# Patient Record
Sex: Female | Born: 1990 | Race: Black or African American | Hispanic: No | Marital: Single | Smoking: Never smoker
Health system: Southern US, Community
[De-identification: ages and names within clinical notes are randomized; demographics above are authoritative.]

## PROBLEM LIST (undated history)

## (undated) DIAGNOSIS — J40 Bronchitis, not specified as acute or chronic: Secondary | ICD-10-CM

## (undated) DIAGNOSIS — Z789 Other specified health status: Secondary | ICD-10-CM

## (undated) HISTORY — PX: ADENOIDECTOMY: SUR15

---
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
## 2011-02-05 ENCOUNTER — Emergency Department (HOSPITAL_COMMUNITY)
Admission: EM | Admit: 2011-02-05 | Discharge: 2011-02-05 | Discharge status: Against Medical Advice | Payer: Self-pay | Attending: Emergency Medicine | Admitting: Emergency Medicine

## 2011-02-05 DIAGNOSIS — R109 Unspecified abdominal pain: Secondary | ICD-10-CM | POA: Insufficient documentation

## 2011-02-19 ENCOUNTER — Emergency Department (HOSPITAL_COMMUNITY)
Admission: EM | Admit: 2011-02-19 | Discharge: 2011-02-19 | Disposition: A | Discharge status: Home / Self Care | Payer: Self-pay | Attending: Emergency Medicine | Admitting: Emergency Medicine

## 2011-02-19 ENCOUNTER — Emergency Department (HOSPITAL_COMMUNITY): Payer: Self-pay

## 2011-02-19 DIAGNOSIS — IMO0002 Reserved for concepts with insufficient information to code with codable children: Secondary | ICD-10-CM | POA: Insufficient documentation

## 2011-02-19 DIAGNOSIS — W1809XA Striking against other object with subsequent fall, initial encounter: Secondary | ICD-10-CM | POA: Insufficient documentation

## 2011-02-19 DIAGNOSIS — S8000XA Contusion of unspecified knee, initial encounter: Secondary | ICD-10-CM | POA: Insufficient documentation

## 2011-02-19 DIAGNOSIS — S8990XA Unspecified injury of unspecified lower leg, initial encounter: Secondary | ICD-10-CM | POA: Insufficient documentation

## 2011-02-19 DIAGNOSIS — M25569 Pain in unspecified knee: Secondary | ICD-10-CM | POA: Insufficient documentation

## 2011-02-23 ENCOUNTER — Emergency Department (HOSPITAL_COMMUNITY)
Admission: EM | Admit: 2011-02-23 | Discharge: 2011-02-23 | Disposition: A | Discharge status: Home / Self Care | Payer: Self-pay | Attending: Emergency Medicine | Admitting: Emergency Medicine

## 2011-02-23 DIAGNOSIS — X58XXXA Exposure to other specified factors, initial encounter: Secondary | ICD-10-CM | POA: Insufficient documentation

## 2011-02-23 DIAGNOSIS — M25569 Pain in unspecified knee: Secondary | ICD-10-CM | POA: Insufficient documentation

## 2011-02-23 DIAGNOSIS — Y929 Unspecified place or not applicable: Secondary | ICD-10-CM | POA: Insufficient documentation

## 2011-02-23 DIAGNOSIS — R509 Fever, unspecified: Secondary | ICD-10-CM | POA: Insufficient documentation

## 2011-02-23 DIAGNOSIS — T1490XA Injury, unspecified, initial encounter: Secondary | ICD-10-CM | POA: Insufficient documentation

## 2011-02-23 DIAGNOSIS — R209 Unspecified disturbances of skin sensation: Secondary | ICD-10-CM | POA: Insufficient documentation

## 2011-02-23 DIAGNOSIS — R609 Edema, unspecified: Secondary | ICD-10-CM | POA: Insufficient documentation

## 2011-03-07 LAB — CBC
HCT: 40.6 % (ref 36.0–46.0)
Hemoglobin: 13.8 g/dL (ref 12.0–15.0)
MCH: 30.1 pg (ref 26.0–34.0)
MCV: 88.5 fL (ref 78.0–100.0)
Platelets: 305 10*3/uL (ref 150–400)
RBC: 4.59 MIL/uL (ref 3.87–5.11)
WBC: 11.8 10*3/uL — ABNORMAL HIGH (ref 4.0–10.5)

## 2011-03-07 LAB — RAPID URINE DRUG SCREEN, HOSP PERFORMED
Amphetamines: NOT DETECTED
Benzodiazepines: NOT DETECTED
Cocaine: NOT DETECTED
Tetrahydrocannabinol: NOT DETECTED

## 2011-03-07 LAB — URINALYSIS, ROUTINE W REFLEX MICROSCOPIC
Bilirubin Urine: NEGATIVE
Hgb urine dipstick: NEGATIVE
Ketones, ur: NEGATIVE mg/dL
Nitrite: NEGATIVE
Protein, ur: NEGATIVE mg/dL
Urobilinogen, UA: 0.2 mg/dL (ref 0.0–1.0)
pH: 6.5 (ref 5.0–8.0)

## 2011-03-07 LAB — POCT PREGNANCY, URINE
Preg Test, Ur: NEGATIVE
Preg Test, Ur: NEGATIVE

## 2011-03-07 LAB — HCG, SERUM, QUALITATIVE: Preg, Serum: NEGATIVE

## 2011-03-08 LAB — URINALYSIS, ROUTINE W REFLEX MICROSCOPIC
Glucose, UA: NEGATIVE mg/dL
Ketones, ur: NEGATIVE mg/dL
Nitrite: NEGATIVE
Protein, ur: 100 mg/dL — AB
Specific Gravity, Urine: >1.03 — ABNORMAL HIGH (ref 1.005–1.030)
Urobilinogen, UA: 1 mg/dL (ref 0.0–1.0)
pH: 6 (ref 5.0–8.0)

## 2011-03-08 LAB — GC/CHLAMYDIA PROBE AMP, GENITAL
Chlamydia, DNA Probe: NEGATIVE
GC Probe Amp, Genital: NEGATIVE

## 2011-03-08 LAB — CBC
HCT: 40.9 % (ref 36.0–46.0)
Hemoglobin: 13.7 g/dL (ref 12.0–15.0)
MCH: 31.1 pg (ref 26.0–34.0)
MCHC: 33.6 g/dL (ref 30.0–36.0)
MCV: 92.5 fL (ref 78.0–100.0)
Platelets: 287 10*3/uL (ref 150–400)
RBC: 4.42 MIL/uL (ref 3.87–5.11)
RDW: 12.8 % (ref 11.5–15.5)
WBC: 12.7 10*3/uL — ABNORMAL HIGH (ref 4.0–10.5)

## 2011-03-08 LAB — URINE MICROSCOPIC-ADD ON

## 2011-03-08 LAB — POCT PREGNANCY, URINE: Preg Test, Ur: NEGATIVE

## 2011-03-08 LAB — WET PREP, GENITAL
Trich, Wet Prep: NONE SEEN
Yeast Wet Prep HPF POC: NONE SEEN

## 2011-03-10 LAB — URINE MICROSCOPIC-ADD ON

## 2011-03-10 LAB — URINALYSIS, ROUTINE W REFLEX MICROSCOPIC
Bilirubin Urine: NEGATIVE
Glucose, UA: NEGATIVE mg/dL
Ketones, ur: NEGATIVE mg/dL
Nitrite: NEGATIVE
Protein, ur: 100 mg/dL — AB
Specific Gravity, Urine: >1.03 — ABNORMAL HIGH (ref 1.005–1.030)
Urobilinogen, UA: 0.2 mg/dL (ref 0.0–1.0)
pH: 6 (ref 5.0–8.0)

## 2011-03-10 LAB — WET PREP, GENITAL
Trich, Wet Prep: NONE SEEN
Yeast Wet Prep HPF POC: NONE SEEN

## 2011-03-10 LAB — CBC
Hemoglobin: 13.1 g/dL (ref 12.0–15.0)
MCH: 31.6 pg (ref 26.0–34.0)
MCV: 92.7 fL (ref 78.0–100.0)
RBC: 4.16 MIL/uL (ref 3.87–5.11)
WBC: 13.5 10*3/uL — ABNORMAL HIGH (ref 4.0–10.5)

## 2011-03-10 LAB — URINE CULTURE

## 2011-03-10 LAB — POCT PREGNANCY, URINE: Preg Test, Ur: NEGATIVE

## 2011-03-10 LAB — GC/CHLAMYDIA PROBE AMP, GENITAL: Chlamydia, DNA Probe: NEGATIVE

## 2011-03-14 LAB — COMPREHENSIVE METABOLIC PANEL
AST: 25 U/L (ref 0–37)
Albumin: 4.3 g/dL (ref 3.5–5.2)
Alkaline Phosphatase: 69 U/L (ref 39–117)
Chloride: 104 mEq/L (ref 96–112)
GFR calc Af Amer: >60 mL/min (ref >60)
Potassium: 4.1 mEq/L (ref 3.5–5.1)
Sodium: 137 mEq/L (ref 135–145)
Total Bilirubin: 0.4 mg/dL (ref 0.3–1.2)
Total Protein: 7.6 g/dL (ref 6.0–8.3)

## 2011-03-14 LAB — URINE MICROSCOPIC-ADD ON

## 2011-03-14 LAB — WET PREP, GENITAL
Trich, Wet Prep: NONE SEEN
WBC, Wet Prep HPF POC: NONE SEEN

## 2011-03-14 LAB — URINE CULTURE

## 2011-03-14 LAB — CBC
Platelets: 295 10*3/uL (ref 150–400)
RDW: 12.7 % (ref 11.5–15.5)
WBC: 11.3 10*3/uL — ABNORMAL HIGH (ref 4.0–10.5)

## 2011-03-14 LAB — DIFFERENTIAL
Basophils Absolute: 0 10*3/uL (ref 0.0–0.1)
Basophils Relative: 0 % (ref 0–1)
Eosinophils Relative: 0 % (ref 0–5)
Monocytes Absolute: 0.6 10*3/uL (ref 0.1–1.0)
Monocytes Relative: 5 % (ref 3–12)
Neutro Abs: 8.3 10*3/uL — ABNORMAL HIGH (ref 1.7–7.7)

## 2011-03-14 LAB — URINALYSIS, ROUTINE W REFLEX MICROSCOPIC
Glucose, UA: NEGATIVE mg/dL
Ketones, ur: NEGATIVE mg/dL
Leukocytes, UA: NEGATIVE
Specific Gravity, Urine: 1.025 (ref 1.005–1.030)
pH: 8 (ref 5.0–8.0)

## 2011-03-14 LAB — GC/CHLAMYDIA PROBE AMP, GENITAL
Chlamydia, DNA Probe: POSITIVE — AB
GC Probe Amp, Genital: NEGATIVE

## 2011-03-17 LAB — URINALYSIS, ROUTINE W REFLEX MICROSCOPIC
Bilirubin Urine: NEGATIVE
Glucose, UA: NEGATIVE mg/dL
Hgb urine dipstick: NEGATIVE
Ketones, ur: NEGATIVE mg/dL
Leukocytes, UA: NEGATIVE
Nitrite: NEGATIVE
Protein, ur: NEGATIVE mg/dL
Specific Gravity, Urine: 1.023 (ref 1.005–1.030)
Urobilinogen, UA: 1 mg/dL (ref 0.0–1.0)
pH: 8 (ref 5.0–8.0)

## 2011-03-17 LAB — URINE CULTURE: Colony Count: >=100000

## 2011-03-17 LAB — URINE MICROSCOPIC-ADD ON

## 2011-03-30 ENCOUNTER — Emergency Department (HOSPITAL_COMMUNITY): Payer: Self-pay

## 2011-03-30 ENCOUNTER — Emergency Department (HOSPITAL_COMMUNITY)
Admission: EM | Admit: 2011-03-30 | Discharge: 2011-03-30 | Disposition: A | Discharge status: Home / Self Care | Payer: Self-pay | Attending: Emergency Medicine | Admitting: Emergency Medicine

## 2011-03-30 DIAGNOSIS — R0989 Other specified symptoms and signs involving the circulatory and respiratory systems: Secondary | ICD-10-CM | POA: Insufficient documentation

## 2011-03-30 DIAGNOSIS — R072 Precordial pain: Secondary | ICD-10-CM | POA: Insufficient documentation

## 2011-03-30 DIAGNOSIS — R0609 Other forms of dyspnea: Secondary | ICD-10-CM | POA: Insufficient documentation

## 2011-03-30 DIAGNOSIS — R091 Pleurisy: Secondary | ICD-10-CM | POA: Insufficient documentation

## 2011-03-30 LAB — CBC
MCH: 29.6 pg (ref 26.0–34.0)
MCHC: 32.6 g/dL (ref 30.0–36.0)
MCV: 90.8 fL (ref 78.0–100.0)
Platelets: 311 10*3/uL (ref 150–400)
RDW: 12.1 % (ref 11.5–15.5)

## 2011-03-30 LAB — BASIC METABOLIC PANEL
BUN: 12 mg/dL (ref 6–23)
Calcium: 9.3 mg/dL (ref 8.4–10.5)
Chloride: 106 mEq/L (ref 96–112)
Creatinine, Ser: 0.72 mg/dL (ref 0.4–1.2)
GFR calc Af Amer: >60 mL/min (ref >60)
GFR calc non Af Amer: >60 mL/min (ref >60)

## 2011-03-30 LAB — D-DIMER, QUANTITATIVE: D-Dimer, Quant: <0.22 ug/mL-FEU (ref 0.00–0.48)

## 2011-03-30 LAB — DIFFERENTIAL
Basophils Relative: 0 % (ref 0–1)
Eosinophils Absolute: 0.1 10*3/uL (ref 0.0–0.7)
Eosinophils Relative: 1 % (ref 0–5)
Lymphs Abs: 3.4 10*3/uL (ref 0.7–4.0)
Monocytes Absolute: 0.6 10*3/uL (ref 0.1–1.0)
Monocytes Relative: 6 % (ref 3–12)

## 2011-04-10 ENCOUNTER — Emergency Department (HOSPITAL_COMMUNITY)
Admission: EM | Admit: 2011-04-10 | Discharge: 2011-04-10 | Disposition: A | Discharge status: Home / Self Care | Payer: Self-pay | Attending: Emergency Medicine | Admitting: Emergency Medicine

## 2011-04-10 DIAGNOSIS — R059 Cough, unspecified: Secondary | ICD-10-CM | POA: Insufficient documentation

## 2011-04-10 DIAGNOSIS — R05 Cough: Secondary | ICD-10-CM | POA: Insufficient documentation

## 2011-04-10 DIAGNOSIS — R062 Wheezing: Secondary | ICD-10-CM | POA: Insufficient documentation

## 2011-04-11 ENCOUNTER — Emergency Department (HOSPITAL_COMMUNITY)
Admission: EM | Admit: 2011-04-11 | Discharge: 2011-04-11 | Disposition: A | Discharge status: Home / Self Care | Payer: Self-pay | Attending: Emergency Medicine | Admitting: Emergency Medicine

## 2011-04-11 ENCOUNTER — Emergency Department (HOSPITAL_COMMUNITY): Payer: Self-pay

## 2011-04-11 DIAGNOSIS — R05 Cough: Secondary | ICD-10-CM | POA: Insufficient documentation

## 2011-04-11 DIAGNOSIS — J189 Pneumonia, unspecified organism: Secondary | ICD-10-CM | POA: Insufficient documentation

## 2011-04-11 DIAGNOSIS — R059 Cough, unspecified: Secondary | ICD-10-CM | POA: Insufficient documentation

## 2011-06-01 ENCOUNTER — Ambulatory Visit (INDEPENDENT_AMBULATORY_CARE_PROVIDER_SITE_OTHER): Payer: Self-pay

## 2011-06-01 ENCOUNTER — Inpatient Hospital Stay (INDEPENDENT_AMBULATORY_CARE_PROVIDER_SITE_OTHER)
Admission: RE | Admit: 2011-06-01 | Discharge: 2011-06-01 | Disposition: A | Discharge status: Home / Self Care | Payer: Self-pay | Source: Ambulatory Visit | Attending: Emergency Medicine | Admitting: Emergency Medicine

## 2011-06-01 DIAGNOSIS — S63509A Unspecified sprain of unspecified wrist, initial encounter: Secondary | ICD-10-CM

## 2011-06-16 ENCOUNTER — Inpatient Hospital Stay (HOSPITAL_COMMUNITY)
Admission: AD | Admit: 2011-06-16 | Discharge: 2011-06-16 | Disposition: A | Discharge status: Home / Self Care | Payer: Self-pay | Source: Ambulatory Visit | Attending: Obstetrics & Gynecology | Admitting: Obstetrics & Gynecology

## 2011-06-16 DIAGNOSIS — R112 Nausea with vomiting, unspecified: Secondary | ICD-10-CM

## 2011-06-16 LAB — URINE MICROSCOPIC-ADD ON

## 2011-06-16 LAB — URINALYSIS, ROUTINE W REFLEX MICROSCOPIC
Glucose, UA: NEGATIVE mg/dL
Specific Gravity, Urine: >1.03 — ABNORMAL HIGH (ref 1.005–1.030)
pH: 6 (ref 5.0–8.0)

## 2011-06-16 LAB — HCG, SERUM, QUALITATIVE: Preg, Serum: NEGATIVE

## 2011-09-25 ENCOUNTER — Emergency Department (HOSPITAL_COMMUNITY): Payer: Self-pay

## 2011-09-25 ENCOUNTER — Emergency Department (HOSPITAL_COMMUNITY)
Admission: EM | Admit: 2011-09-25 | Discharge: 2011-09-25 | Disposition: A | Discharge status: Home / Self Care | Payer: Self-pay | Attending: Emergency Medicine | Admitting: Emergency Medicine

## 2011-09-25 DIAGNOSIS — N949 Unspecified condition associated with female genital organs and menstrual cycle: Secondary | ICD-10-CM | POA: Insufficient documentation

## 2011-09-25 DIAGNOSIS — A499 Bacterial infection, unspecified: Secondary | ICD-10-CM | POA: Insufficient documentation

## 2011-09-25 DIAGNOSIS — N760 Acute vaginitis: Secondary | ICD-10-CM | POA: Insufficient documentation

## 2011-09-25 DIAGNOSIS — N7093 Salpingitis and oophoritis, unspecified: Secondary | ICD-10-CM | POA: Insufficient documentation

## 2011-09-25 DIAGNOSIS — B9689 Other specified bacterial agents as the cause of diseases classified elsewhere: Secondary | ICD-10-CM | POA: Insufficient documentation

## 2011-09-25 DIAGNOSIS — R1032 Left lower quadrant pain: Secondary | ICD-10-CM | POA: Insufficient documentation

## 2011-09-25 DIAGNOSIS — R10814 Left lower quadrant abdominal tenderness: Secondary | ICD-10-CM | POA: Insufficient documentation

## 2011-09-25 DIAGNOSIS — R112 Nausea with vomiting, unspecified: Secondary | ICD-10-CM | POA: Insufficient documentation

## 2011-09-25 LAB — POCT I-STAT, CHEM 8
Calcium, Ion: 1.24 mmol/L (ref 1.12–1.32)
Creatinine, Ser: 0.8 mg/dL (ref 0.50–1.10)
Glucose, Bld: 106 mg/dL — ABNORMAL HIGH (ref 70–99)
HCT: 42 % (ref 36.0–46.0)
Hemoglobin: 14.3 g/dL (ref 12.0–15.0)

## 2011-09-25 LAB — CBC
MCHC: 33.8 g/dL (ref 30.0–36.0)
MCV: 90.1 fL (ref 78.0–100.0)
Platelets: 325 10*3/uL (ref 150–400)
RDW: 12.3 % (ref 11.5–15.5)
WBC: 15.6 10*3/uL — ABNORMAL HIGH (ref 4.0–10.5)

## 2011-09-25 LAB — URINALYSIS, ROUTINE W REFLEX MICROSCOPIC
Bilirubin Urine: NEGATIVE
Nitrite: NEGATIVE
Protein, ur: NEGATIVE mg/dL
Specific Gravity, Urine: 1.024 (ref 1.005–1.030)
Urobilinogen, UA: 1 mg/dL (ref 0.0–1.0)

## 2011-09-25 LAB — WET PREP, GENITAL: Yeast Wet Prep HPF POC: NONE SEEN

## 2011-09-25 LAB — URINE MICROSCOPIC-ADD ON

## 2011-09-25 LAB — DIFFERENTIAL
Basophils Absolute: 0 10*3/uL (ref 0.0–0.1)
Eosinophils Absolute: 0 10*3/uL (ref 0.0–0.7)
Eosinophils Relative: 0 % (ref 0–5)
Monocytes Absolute: 0.7 10*3/uL (ref 0.1–1.0)

## 2011-09-26 LAB — GC/CHLAMYDIA PROBE AMP, GENITAL: GC Probe Amp, Genital: NEGATIVE

## 2011-10-01 IMAGING — CR DG FOOT COMPLETE 3+V*L*
3 series · 3 of 3 positions shown · non-contrast
Comparison: None.

CLINICAL DATA: Dog bite

LEFT FOOT - COMPLETE 3+ VIEW

[t foot ap left]
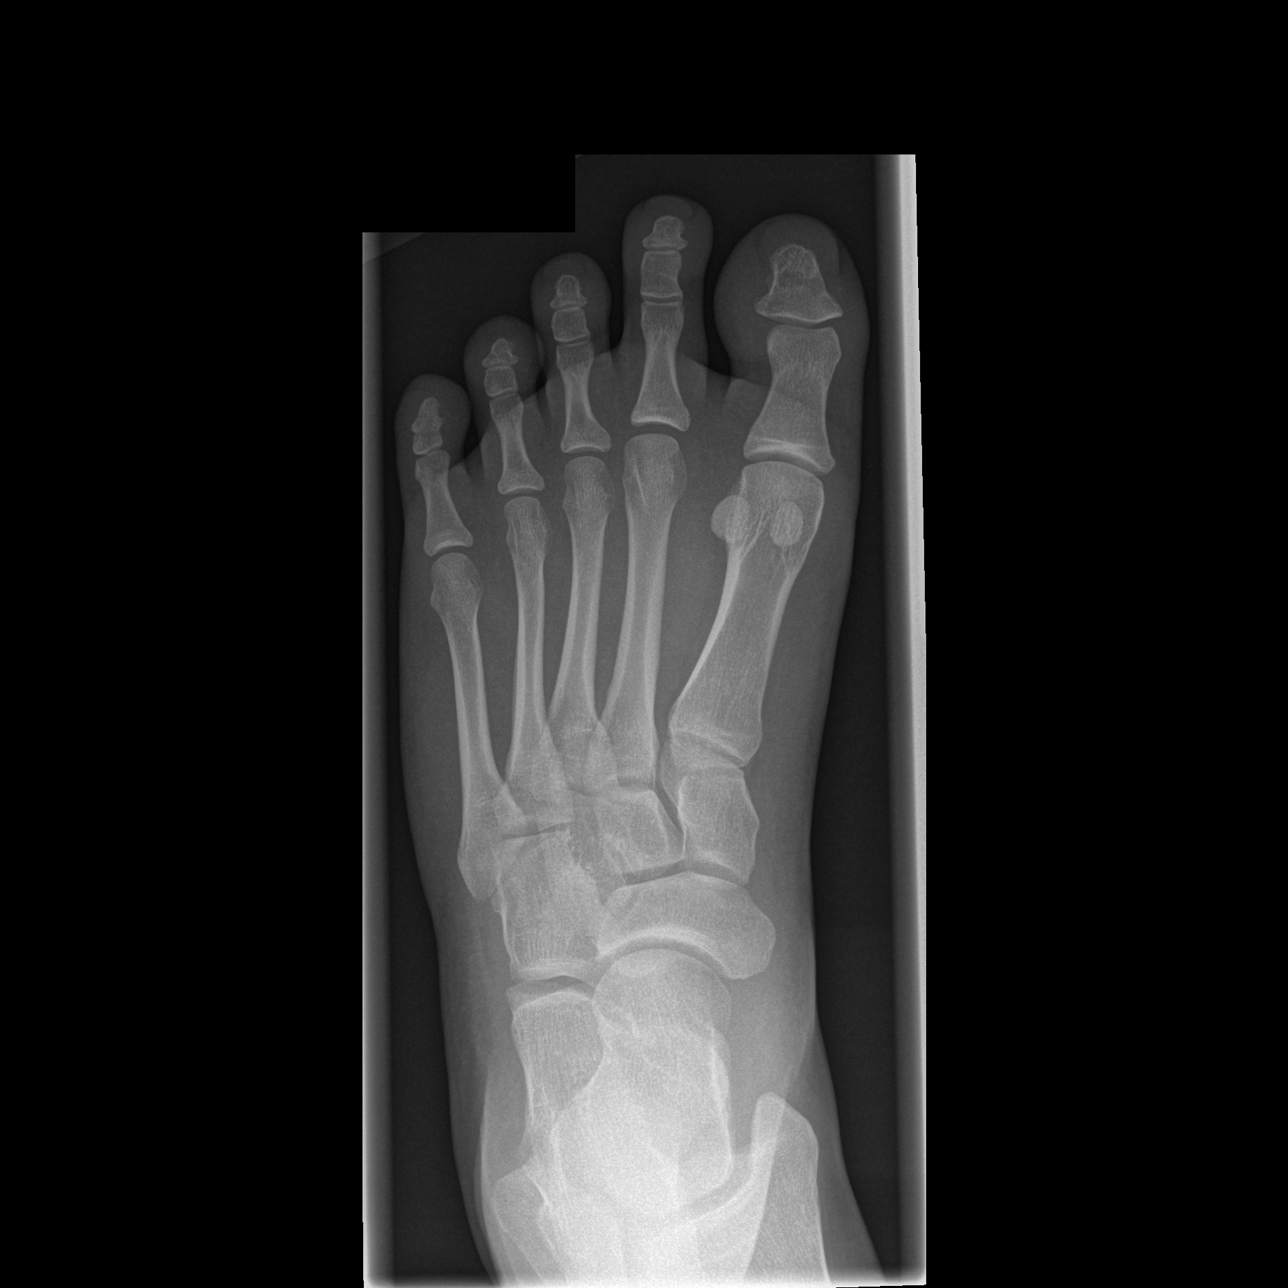

[t foot lat left]
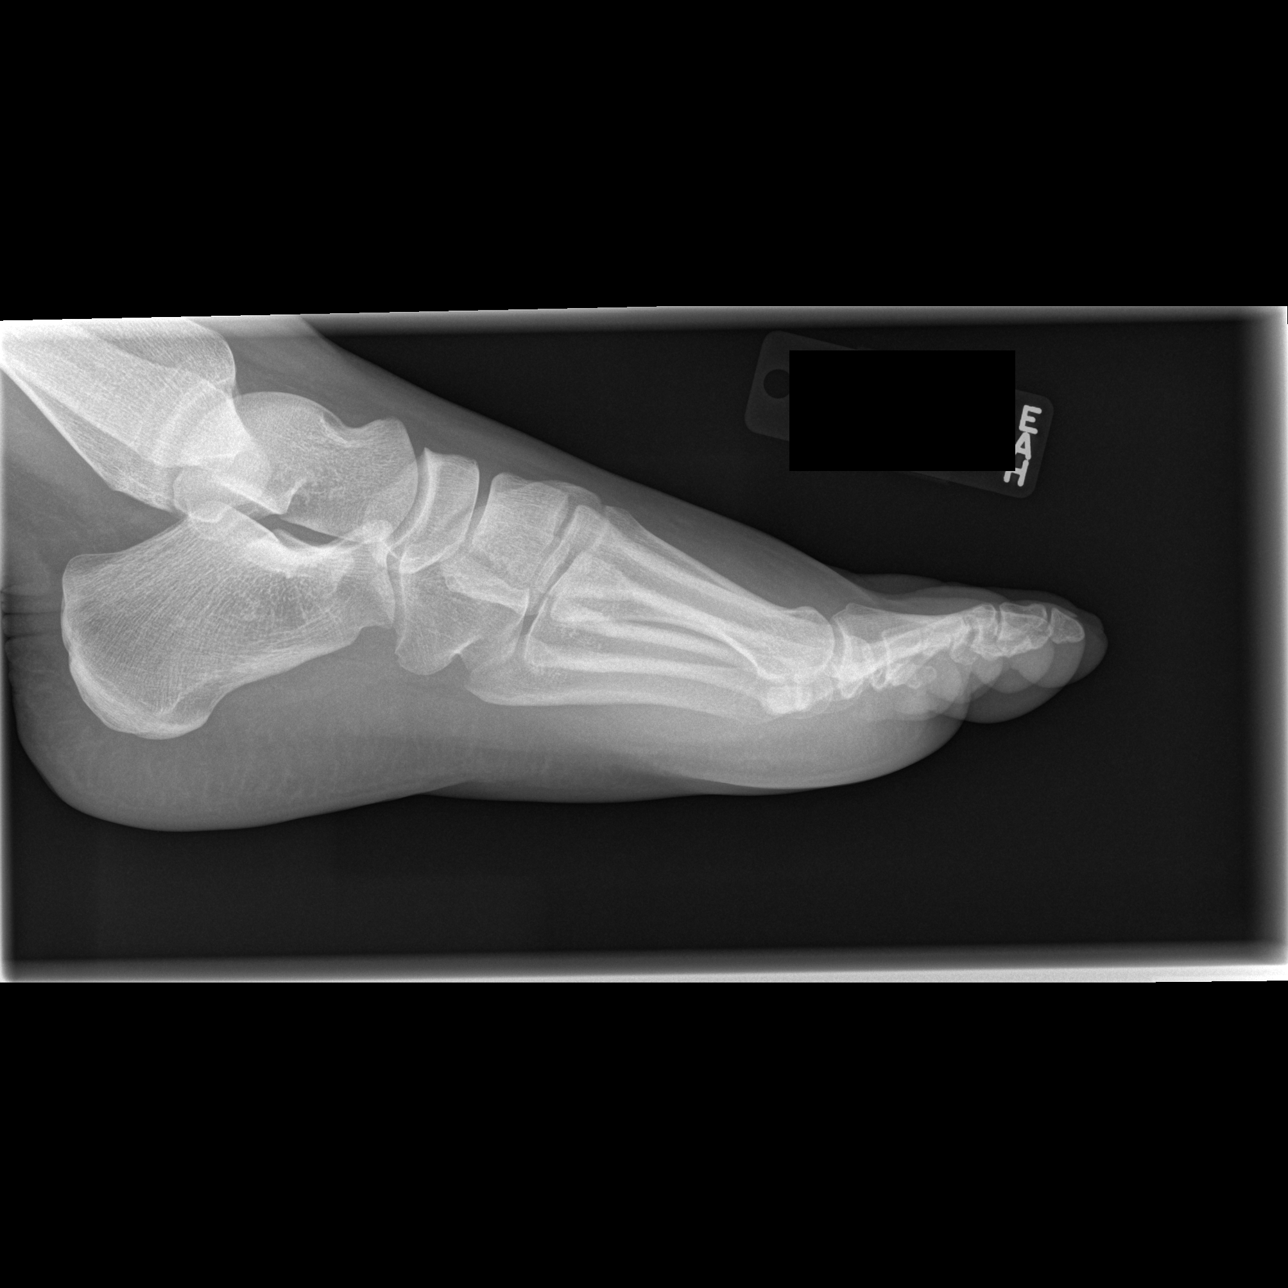

[t foot oblique left]
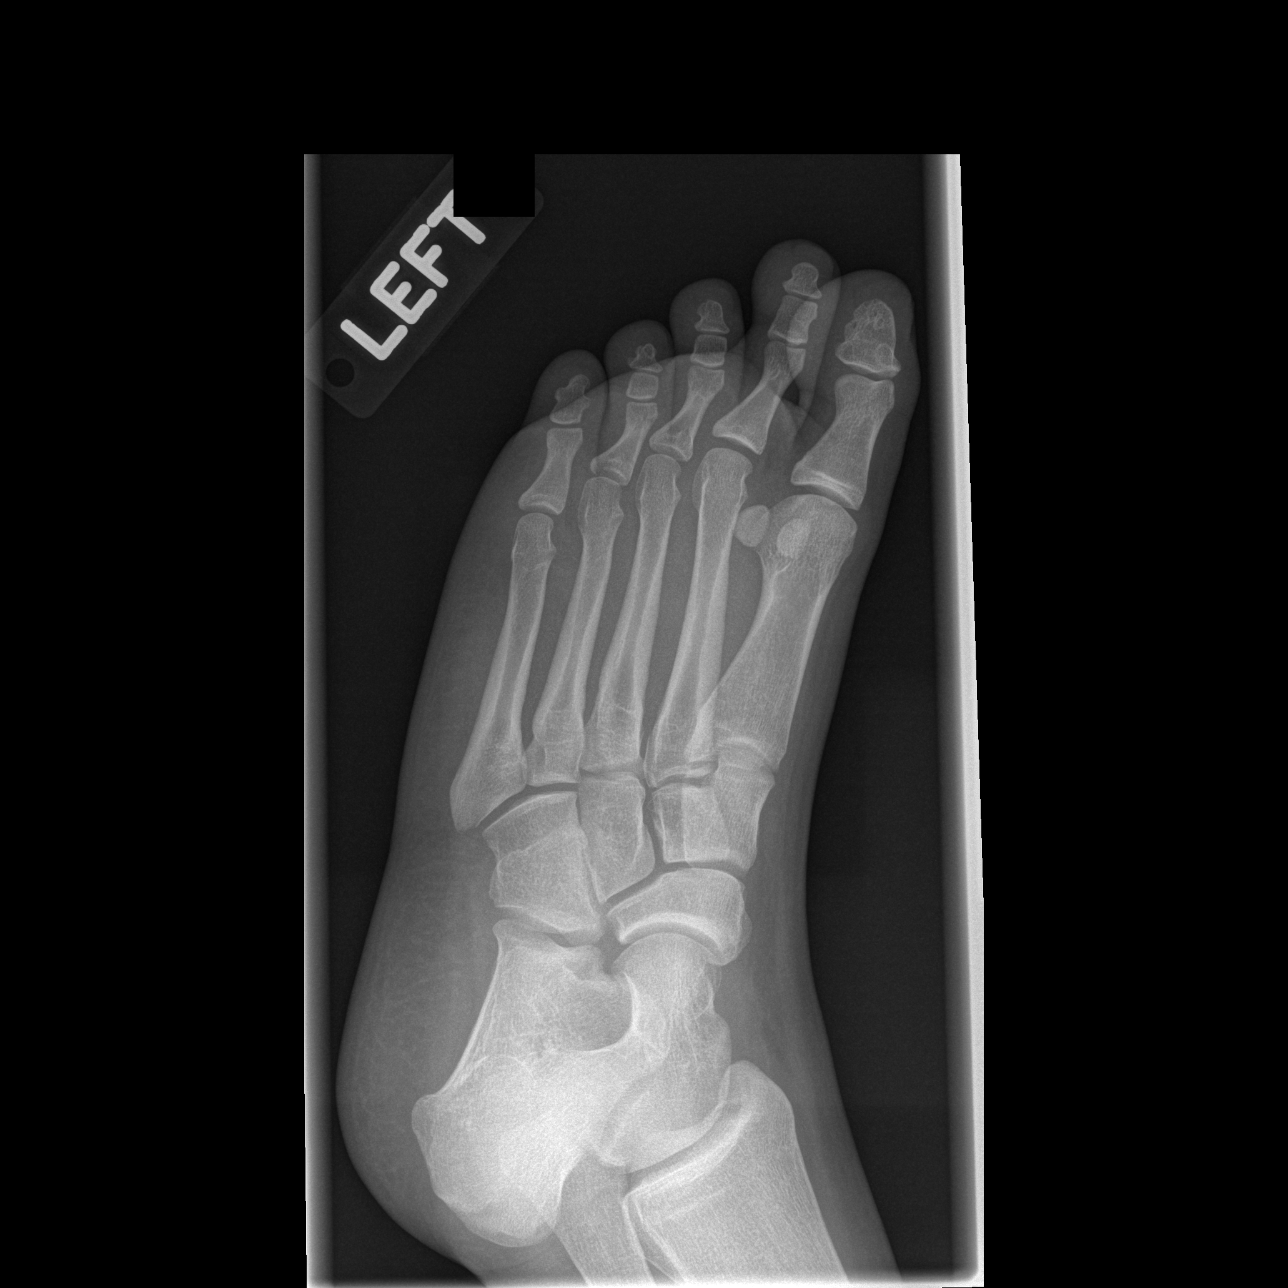

[3 of 3 positions shown; findings below may reference images not displayed]

FINDINGS: There is no evidence of fracture or dislocation.  There
is no evidence of arthropathy or other focal bone abnormality.
Soft tissues are unremarkable.
IMPRESSION: Negative.

## 2011-11-05 ENCOUNTER — Emergency Department (HOSPITAL_COMMUNITY)
Admission: EM | Admit: 2011-11-05 | Discharge: 2011-11-05 | Discharge status: Against Medical Advice | Payer: Self-pay | Attending: Emergency Medicine | Admitting: Emergency Medicine

## 2011-11-05 DIAGNOSIS — Z0389 Encounter for observation for other suspected diseases and conditions ruled out: Secondary | ICD-10-CM | POA: Insufficient documentation

## 2011-11-05 NOTE — ED Notes (Signed)
Patient's mother came and took patient to [facility] ED

## 2011-12-09 ENCOUNTER — Emergency Department (HOSPITAL_COMMUNITY)
Admission: EM | Admit: 2011-12-09 | Discharge: 2011-12-09 | Discharge status: Absent without leave | Payer: Self-pay | Source: Home / Self Care | Attending: Family Medicine | Admitting: Family Medicine

## 2011-12-16 NOTE — ED Provider Notes (Addendum)
History     CSN: [id number]  Arrival date & time 12/09/11  1722   First MD Initiated Contact with Patient 12/09/11 1729      No chief complaint on file.   (Consider location/radiation/quality/duration/timing/severity/associated sxs/prior treatment) HPI  No past medical history on file.  No past surgical history on file.  No family history on file.  History  Substance Use Topics  . Smoking status: Not on file  . Smokeless tobacco: Not on file  . Alcohol Use: Not on file    OB History    No data available      Review of Systems  Allergies  Review of patient's allergies indicates no known allergies.  Home Medications  No current outpatient prescriptions on file.  There were no vitals taken for this visit.  Physical Exam  ED Course  Procedures (including critical care time)  Labs Reviewed - No data to display No results found.   No diagnosis found.  Patient not seen by me  MDM          Randa Spike, MD 12/16/11 [id number]  Randa Spike, MD 12/16/11 [phone]

## 2012-01-01 ENCOUNTER — Encounter: Payer: Self-pay | Admitting: *Deleted

## 2012-01-01 ENCOUNTER — Emergency Department (HOSPITAL_COMMUNITY)
Admission: EM | Admit: 2012-01-01 | Discharge: 2012-01-01 | Disposition: A | Discharge status: Home / Self Care | Payer: Self-pay | Attending: Emergency Medicine | Admitting: Emergency Medicine

## 2012-01-01 ENCOUNTER — Emergency Department (HOSPITAL_COMMUNITY): Payer: Self-pay

## 2012-01-01 DIAGNOSIS — R1110 Vomiting, unspecified: Secondary | ICD-10-CM | POA: Insufficient documentation

## 2012-01-01 DIAGNOSIS — S63501A Unspecified sprain of right wrist, initial encounter: Secondary | ICD-10-CM

## 2012-01-01 DIAGNOSIS — S59919A Unspecified injury of unspecified forearm, initial encounter: Secondary | ICD-10-CM | POA: Insufficient documentation

## 2012-01-01 DIAGNOSIS — M25539 Pain in unspecified wrist: Secondary | ICD-10-CM | POA: Insufficient documentation

## 2012-01-01 DIAGNOSIS — S63509A Unspecified sprain of unspecified wrist, initial encounter: Secondary | ICD-10-CM | POA: Insufficient documentation

## 2012-01-01 DIAGNOSIS — R109 Unspecified abdominal pain: Secondary | ICD-10-CM | POA: Insufficient documentation

## 2012-01-01 DIAGNOSIS — S60219A Contusion of unspecified wrist, initial encounter: Secondary | ICD-10-CM | POA: Insufficient documentation

## 2012-01-01 DIAGNOSIS — S6990XA Unspecified injury of unspecified wrist, hand and finger(s), initial encounter: Secondary | ICD-10-CM | POA: Insufficient documentation

## 2012-01-01 DIAGNOSIS — S59909A Unspecified injury of unspecified elbow, initial encounter: Secondary | ICD-10-CM | POA: Insufficient documentation

## 2012-01-01 MED ORDER — OXYCODONE-ACETAMINOPHEN 5-325 MG PO TABS
1.0000 | ORAL_TABLET | Freq: Once | ORAL | Status: AC
  Administered 2012-01-01: 1 via ORAL
  Filled 2012-01-01: qty 1

## 2012-01-01 MED ORDER — OXYCODONE-ACETAMINOPHEN 5-325 MG PO TABS: 1.0000 | ORAL_TABLET | Freq: Four times a day (QID) | ORAL | Status: AC | PRN

## 2012-01-01 NOTE — ED Notes (Signed)
Ortho paged. 
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
## 2012-02-14 ENCOUNTER — Encounter (HOSPITAL_COMMUNITY): Payer: Self-pay

## 2012-02-14 ENCOUNTER — Emergency Department (HOSPITAL_COMMUNITY)
Admission: EM | Admit: 2012-02-14 | Discharge: 2012-02-14 | Disposition: A | Discharge status: Home / Self Care | Payer: Self-pay | Attending: Emergency Medicine | Admitting: Emergency Medicine

## 2012-02-14 DIAGNOSIS — R109 Unspecified abdominal pain: Secondary | ICD-10-CM | POA: Insufficient documentation

## 2012-02-14 DIAGNOSIS — O039 Complete or unspecified spontaneous abortion without complication: Secondary | ICD-10-CM

## 2012-02-14 LAB — URINE MICROSCOPIC-ADD ON

## 2012-02-14 LAB — URINALYSIS, ROUTINE W REFLEX MICROSCOPIC
Glucose, UA: NEGATIVE mg/dL
Ketones, ur: 15 mg/dL — AB
Protein, ur: 100 mg/dL — AB
pH: 6.5 (ref 5.0–8.0)

## 2012-02-14 MED ORDER — OXYCODONE-ACETAMINOPHEN 5-325 MG PO TABS
2.0000 | ORAL_TABLET | Freq: Once | ORAL | Status: AC
  Administered 2012-02-14: 2 via ORAL
  Filled 2012-02-14: qty 2

## 2012-02-14 MED ORDER — OXYCODONE-ACETAMINOPHEN 5-325 MG PO TABS: 1.0000 | ORAL_TABLET | Freq: Four times a day (QID) | ORAL | Status: AC | PRN
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
## 2012-02-14 NOTE — ED Provider Notes (Signed)
History     CSN: [id number]  Arrival date & time 02/14/12  1448   First MD Initiated Contact with Patient 02/14/12 1905      Chief Complaint  Patient presents with  . Vaginal Bleeding    (Consider location/radiation/quality/duration/timing/severity/associated sxs/prior treatment) HPI  The patient presents to the emergency department with complaints of vaginal bleeding. The patient has had two positive pregnancy tests at home, however, yesterday she began to bleed. She is not having abdominal pain but admits to lumbar and low abdominal cramps. She states that she passed a large clot of tissue last night. Today she comes to get evaluated for the pain and to see if she has lost the baby. She denies weakness or large amounts of blood loss. She denies history of miscarriage.   History reviewed. No pertinent past medical history.  History reviewed. No pertinent past surgical history.  No family history on file.  History  Substance Use Topics  . Smoking status: Never Smoker   . Smokeless tobacco: Not on file  . Alcohol Use: No    OB History    Grav Para Term Preterm Abortions TAB SAB Ect Mult Living                  Review of Systems  All other systems reviewed and are negative.    Allergies  Review of patient's allergies indicates no known allergies.  Home Medications   Current Outpatient Rx  Name Route Sig Dispense Refill  . OXYCODONE-ACETAMINOPHEN 5-325 MG PO TABS Oral Take 1 tablet by mouth every 6 (six) hours as needed for pain. 10 tablet 0    BP 114/57  Pulse 130  Temp(Src) 98.5 F (36.9 C) (Oral)  Resp 18  Ht 5\' 3"  (1.6 m)  Wt 180 lb (81.647 kg)  BMI 31.89 kg/m2  SpO2 99%  Physical Exam  Constitutional: She appears well-developed and well-nourished.  HENT:  Head: Normocephalic and atraumatic.  Eyes: Conjunctivae are normal. Pupils are equal, round, and reactive to light.  Neck: Trachea normal, normal range of motion and full passive range of motion  without pain. Neck supple.  Cardiovascular: Normal rate, regular rhythm and normal pulses.   Pulmonary/Chest: Effort normal and breath sounds normal. Chest wall is not dull to percussion. She exhibits no tenderness, no crepitus, no edema, no deformity and no retraction.  Abdominal: Soft. Normal appearance and bowel sounds are normal. She exhibits no distension and no mass. There is tenderness. There is no rebound and no guarding.  Genitourinary: Cervix exhibits no motion tenderness, no discharge and no friability. There is tenderness and bleeding around the vagina. No erythema around the vagina. No foreign body around the vagina. No signs of injury around the vagina. No vaginal discharge found.    Musculoskeletal: Normal range of motion.  Neurological: She is alert. She has normal strength.  Skin: Skin is warm, dry and intact.  Psychiatric: Her speech is normal. Cognition and memory are normal.    ED Course  Procedures (including critical care time)  Labs Reviewed  URINALYSIS, ROUTINE W REFLEX MICROSCOPIC - Abnormal; Notable for the following:    Color, Urine RED (*) BIOCHEMICALS MAY BE AFFECTED BY COLOR   APPearance CLOUDY (*)    Hgb urine dipstick LARGE (*)    Bilirubin Urine SMALL (*)    Ketones, ur 15 (*)    Protein, ur 100 (*)    Nitrite POSITIVE (*)    Leukocytes, UA LARGE (*)    All other  components within normal limits  URINE MICROSCOPIC-ADD ON - Abnormal; Notable for the following:    Squamous Epithelial / LPF MANY (*)    Bacteria, UA FEW (*)    All other components within normal limits  POCT PREGNANCY, URINE  WET PREP, GENITAL  GC/CHLAMYDIA PROBE AMP, GENITAL   No results found.   1. Miscarriage       MDM  Pt states that she will follow-up with her doctor in the next couple of days and have her urine rechecked for infection as she just wants to go home right now.  The patients pregnancy test is negative for pregnancy and the cervical os is closed, blood was noted  to be in the canal but no gross tissues. Therefore, no more work-up is going to be done at this time. I have noted started the patient on abx as she will have her urine rechecked. Pt was also given a Rx for pain medication for her lower back and abdominal cramping.          Dorthula Matas, PA 02/15/12 0001

## 2012-02-14 NOTE — ED Notes (Signed)
Pt. Is pregnant, does not know when her last period was did take 2 pregnancy home test  Last week and they were both + for pregnancy, she began passing blood last night with clots in it, Having vaginal bleeding today.  Intermittent abdominal cramping and lumbar pain

## 2012-02-14 NOTE — ED Notes (Signed)
Assisted PA with pelvic exam. Pt tolerated well.  

## 2012-02-15 NOTE — ED Provider Notes (Signed)
Medical screening examination/treatment/procedure(s) were performed by non-physician practitioner and as supervising physician I was immediately available for consultation/collaboration.  Doug Sou, MD 02/15/12 [phone]

## 2012-04-01 ENCOUNTER — Inpatient Hospital Stay (HOSPITAL_COMMUNITY): Payer: Self-pay

## 2012-04-01 ENCOUNTER — Inpatient Hospital Stay (HOSPITAL_COMMUNITY)
Admission: AD | Admit: 2012-04-01 | Discharge: 2012-04-01 | Disposition: A | Discharge status: Home / Self Care | Payer: Self-pay | Source: Ambulatory Visit | Attending: Obstetrics & Gynecology | Admitting: Obstetrics & Gynecology

## 2012-04-01 ENCOUNTER — Encounter (HOSPITAL_COMMUNITY): Payer: Self-pay | Admitting: *Deleted

## 2012-04-01 DIAGNOSIS — N7013 Chronic salpingitis and oophoritis: Secondary | ICD-10-CM

## 2012-04-01 DIAGNOSIS — N7011 Chronic salpingitis: Secondary | ICD-10-CM

## 2012-04-01 DIAGNOSIS — R1032 Left lower quadrant pain: Secondary | ICD-10-CM | POA: Insufficient documentation

## 2012-04-01 HISTORY — DX: Other specified health status: Z78.9

## 2012-04-01 LAB — CBC
MCH: 29.6 pg (ref 26.0–34.0)
MCV: 91.6 fL (ref 78.0–100.0)
Platelets: 332 10*3/uL (ref 150–400)
RDW: 12.6 % (ref 11.5–15.5)
WBC: 21.1 10*3/uL — ABNORMAL HIGH (ref 4.0–10.5)

## 2012-04-01 LAB — URINALYSIS, ROUTINE W REFLEX MICROSCOPIC
Bilirubin Urine: NEGATIVE
Ketones, ur: NEGATIVE mg/dL
Leukocytes, UA: NEGATIVE
Nitrite: NEGATIVE
Specific Gravity, Urine: 1.025 (ref 1.005–1.030)
Urobilinogen, UA: 0.2 mg/dL (ref 0.0–1.0)

## 2012-04-01 LAB — DIFFERENTIAL
Basophils Absolute: 0 10*3/uL (ref 0.0–0.1)
Eosinophils Absolute: 0.2 10*3/uL (ref 0.0–0.7)
Eosinophils Relative: 1 % (ref 0–5)

## 2012-04-01 MED ORDER — PROMETHAZINE HCL 25 MG/ML IJ SOLN
25.0000 mg | Freq: Once | INTRAMUSCULAR | Status: DC
  Filled 2012-04-01: qty 1

## 2012-04-01 MED ORDER — HYDROMORPHONE HCL PF 1 MG/ML IJ SOLN
1.0000 mg | Freq: Once | INTRAMUSCULAR | Status: DC
  Filled 2012-04-01: qty 1

## 2012-04-01 MED ORDER — CEFTRIAXONE SODIUM 250 MG IJ SOLR
250.0000 mg | Freq: Once | INTRAMUSCULAR | Status: AC
  Administered 2012-04-01: 250 mg via INTRAMUSCULAR
  Filled 2012-04-01: qty 250

## 2012-04-01 MED ORDER — DOXYCYCLINE HYCLATE 100 MG PO TABS: 100.0000 mg | ORAL_TABLET | Freq: Two times a day (BID) | ORAL | Status: AC

## 2012-04-01 MED ORDER — PROMETHAZINE HCL 25 MG/ML IJ SOLN
25.0000 mg | Freq: Four times a day (QID) | INTRAMUSCULAR | Status: DC | PRN
  Administered 2012-04-01: 25 mg via INTRAMUSCULAR

## 2012-04-01 MED ORDER — OXYCODONE-ACETAMINOPHEN 10-325 MG PO TABS: 1.0000 | ORAL_TABLET | Freq: Four times a day (QID) | ORAL | Status: AC | PRN

## 2012-04-01 MED ORDER — HYDROMORPHONE HCL PF 1 MG/ML IJ SOLN
1.0000 mg | Freq: Once | INTRAMUSCULAR | Status: AC
Start: 2012-04-01 — End: 2012-04-01
  Administered 2012-04-01: 1 mg via INTRAMUSCULAR

## 2012-04-01 NOTE — Discharge Instructions (Signed)
navigation, search  Hydrosalpinx  Classification and external resources  A hydrosalpinx is a distally blocked fallopian tube filled with serous or clear fluid. The blocked tube may become substantially distended giving the tube a characteristic sausage-like or retort-like shape. The condition is often bilateral and the affected tubes may reach several centimeters in diameter. The blocked tubes cause infertility. A fallopian tube filled with blood is a hematosalpinx, and with pus a pyosalpinx. Hydrosalpinx is a composite of the [place] words ???? (hydro - "water") and ??????? (salpinx - ' "trumpet"); its plural is hydrosalpinges. [edit] Etiology The major cause for distal tubal occlusion is pelvic inflammatory disease (PID), usually as a consequence of an ascending infection by chlamydia or gonorrhea. However, not all pelvic infections will cause distal tubal occlusion. Tubal tuberculosis is an uncommon cause of hydrosalpinx formation. While the ciliae of the inner lining (endosalpinx) of the fallopian tube beat towards the uterus, tubal fluid is normally discharged via the fimbriated end into the peritoneal cavity from where it is cleared. If the fimbriated end of the tube becomes agglutinated, the resulting obstruction does not allow the tubal fluid to pass; it accumulates and reverts its flow downstream, into the uterus, or production is curtailed by damage to the endosalpinx. This tube then is unable to participate in the reproductive process: sperm cannot pass, the egg is not picked up, and fertilization does not take place. Other causes of distal tubal occlusion include adhesion formation from surgery, endometriosis, and cancer of the tube, ovary or other surrounding organs. A hematosalpinx is most commonly associated with an ectopic pregnancy. A pyosalpinx is typically seen in a more acute stage of PID and may be part of a tuboovarian abscess (TOA). Tubal phimosis refers to a situation where the tubal  end is partially occluded, in this case fertility is impeded, and the risk of an ectopic pregnancy is increased. [edit] Symptoms Symptoms can vary. Some patients have lower often recurring abdominal pain or pelvic pain, while others may be asymptomatic. As tubal function is impeded, infertility is a common symptom. Patients who are not trying to get pregnant and have no pain, may go undetected. IUDs, endometriosis, and abdominal surgery sometimes are associated with the problem. As a reaction to injury, the body rushes inflammatory cells into the area, and inflammation and later healing result in loss of the fimbria and closure of the tube. These infections usually affect both fallopian tubes, and although a hydrosalpinx can be one-sided, the other tube on the opposite side is often abnormal. By the time it is detected, the tubal fluid usually is sterile, and does not contain an active infection. [edit] Diagnosis Hydrosalpinx may be diagnosed using ultrasonography as the fluid filled elongated and distended tubes display their typical echolucent pattern. However, a small hydrosalpinx may be missed by sonography. During an infertility work-up a hysterosalpingogram (HSG), an X-ray procedure that uses a contrast agent to image the fallopian tubes, shows the retort-like shape of the distended tubes and the absence of spillage of the dye into the peritoneum. If, however, there is a tubal occlusion at the utero-tubal junction, a hydrosalpinx may go undetected. When a hydrosalpinx is detected by an HSG it is prudent to administer antibiotics to reduce the risk of reactivation of an inflammatory process.
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
## 2012-04-01 NOTE — MAU Note (Signed)
Was sitting watching a movie and started having sharp pain in lower left of stomach. Is getting worse.

## 2012-04-01 NOTE — Progress Notes (Signed)
Written and verbal d/c instructions given and understanding voiced. 

## 2012-04-01 NOTE — MAU Note (Signed)
To us via wc.

## 2012-04-01 NOTE — MAU Provider Note (Signed)
Attestation of Attending Supervision of Advanced Practitioner: Evaluation and management procedures were performed by the [facility] Fellow/PA/CNM/NP under my supervision and collaboration. Chart reviewed, and agree with management and plan.  Jaynie Collins, M.D. 04/01/2012 8:25 AM

## 2012-04-01 NOTE — MAU Note (Signed)
Pt arrived via EMS and checked in at admissions. Pt to Rm #1 via w/c. Doubled over with pain LLQ. Able to go to [facility] to obtain urine specimen and them able to undress and put on gown. Brother with pt. Pt very uncomfortable with pain. Call light within reach.

## 2012-04-02 ENCOUNTER — Encounter: Payer: Self-pay | Admitting: *Deleted

## 2012-04-20 IMAGING — CR DG KNEE COMPLETE 4+V*R*
4 series · 4 of 4 positions shown · non-contrast
Comparison: None

CLINICAL DATA: Fall.  Pain.

RIGHT KNEE - COMPLETE 4+ VIEW

[t knee ap right]
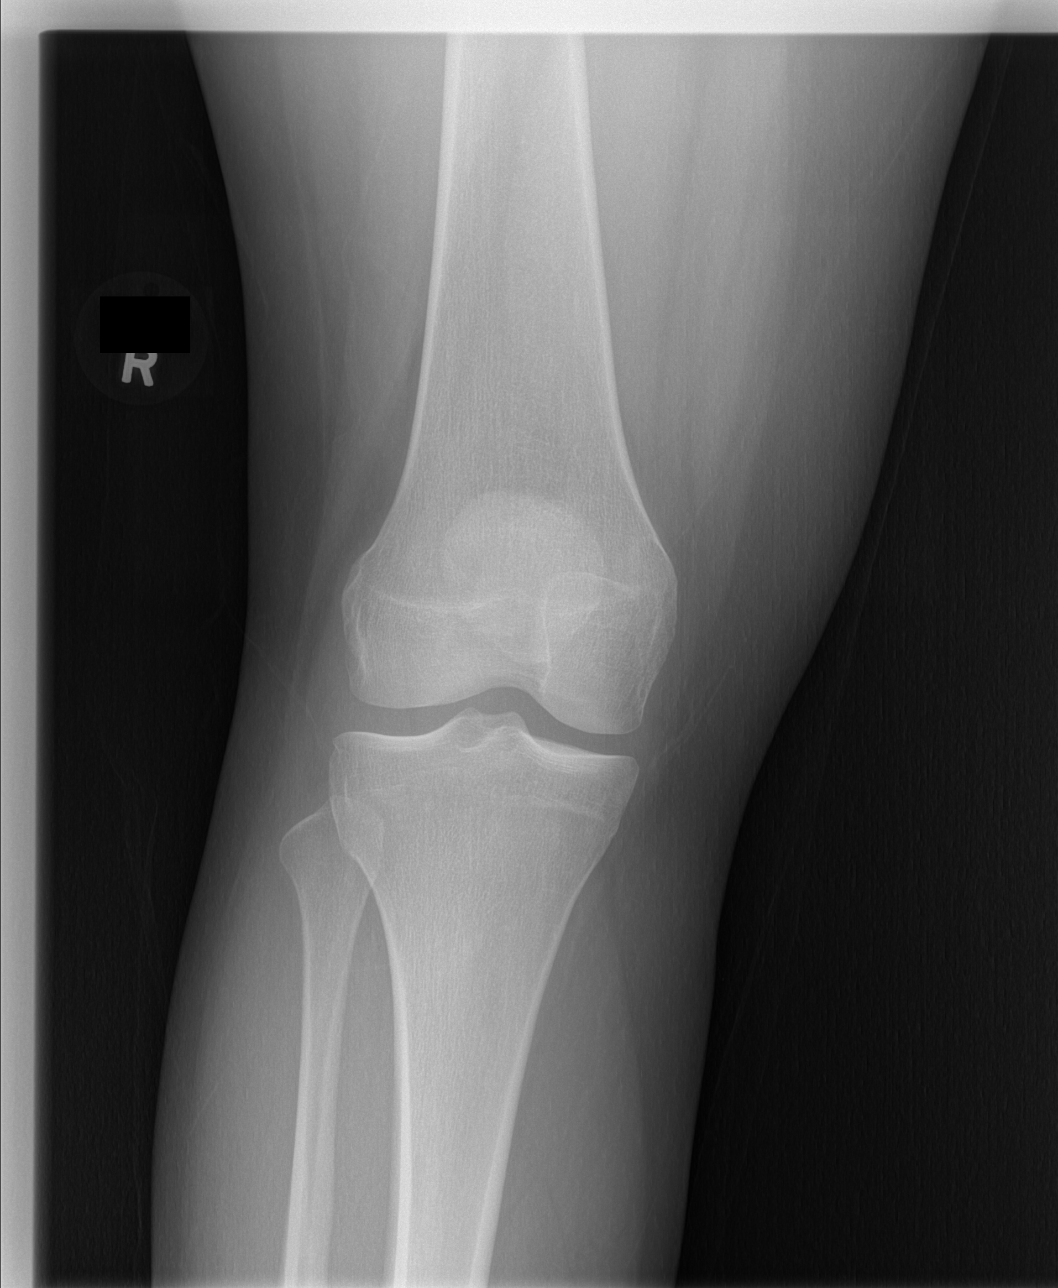

[t knee oblique right (1 of 2)]
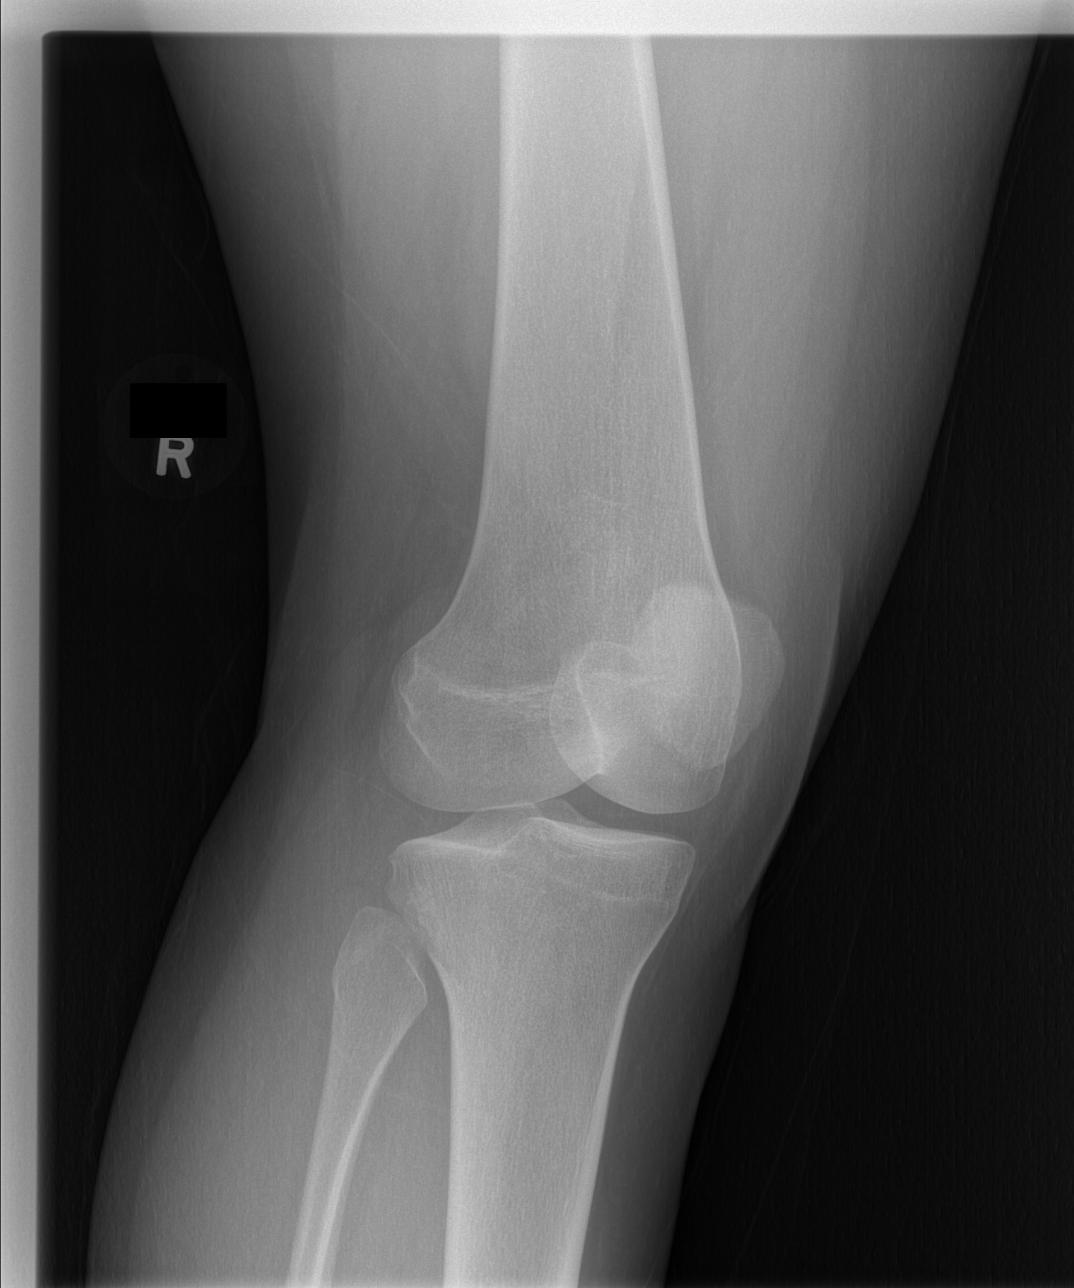

[t knee oblique right (2 of 2)]
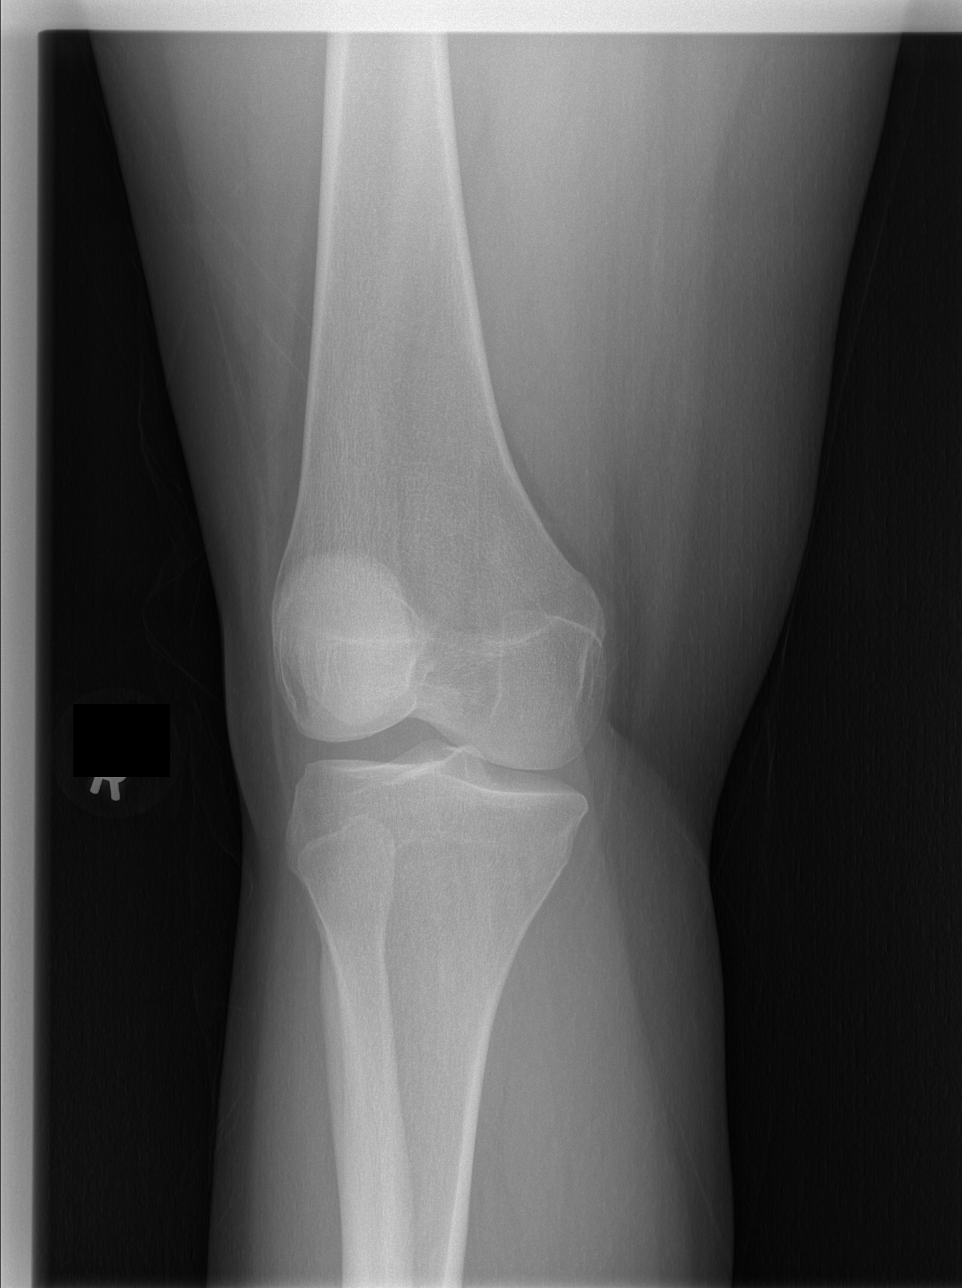

[t knee lat right]
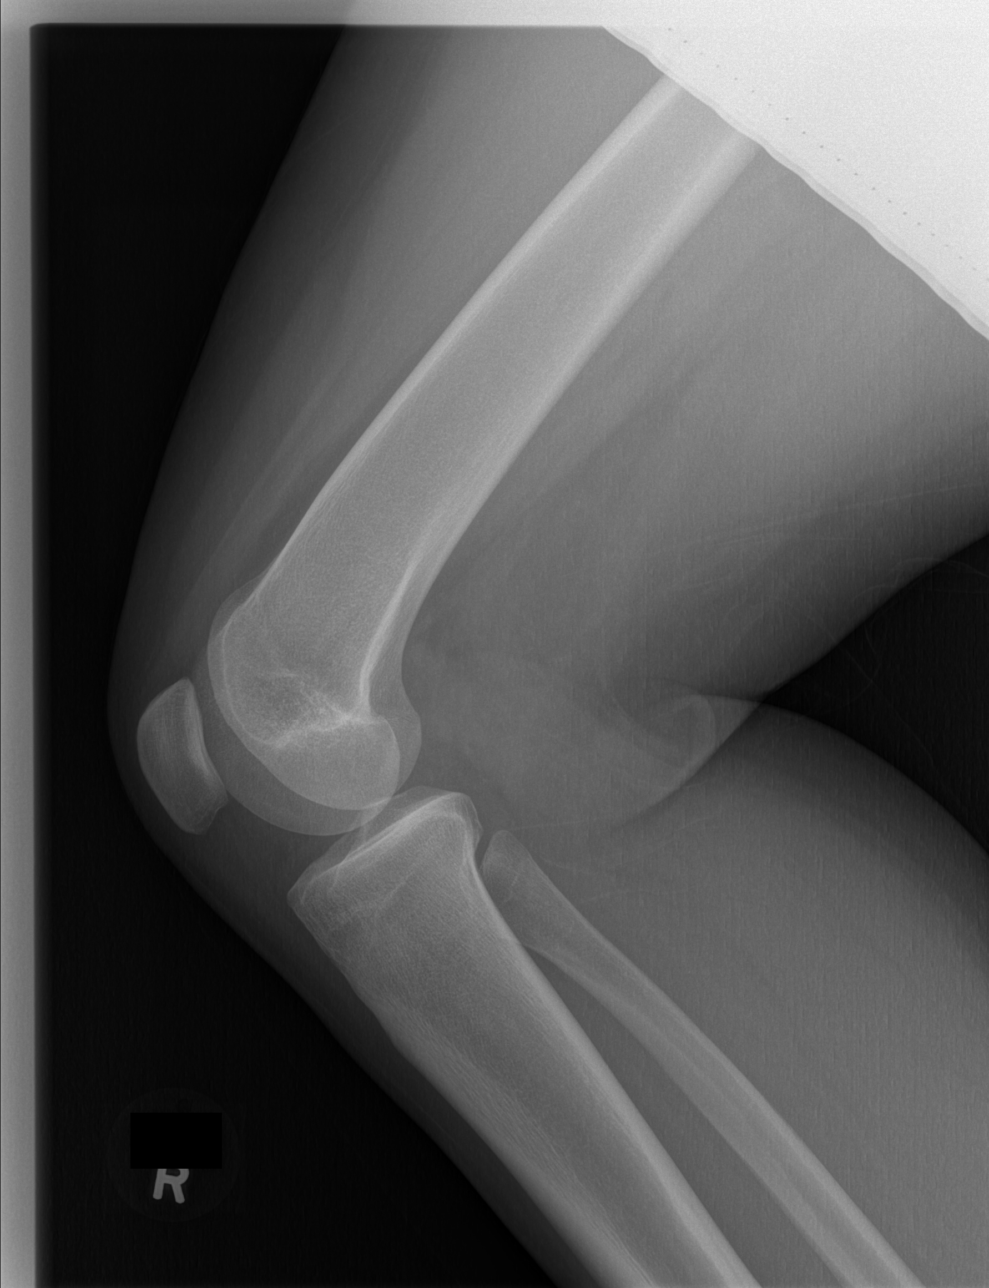

[4 of 4 positions shown; findings below may reference images not displayed]

FINDINGS: No evidence of joint effusion.  No fracture or
dislocation.  No degenerative change or other focal lesion.
IMPRESSION: Negative radiographs

## 2012-05-02 ENCOUNTER — Encounter: Payer: Self-pay | Admitting: Obstetrics and Gynecology

## 2012-05-25 ENCOUNTER — Emergency Department (HOSPITAL_COMMUNITY)
Admission: EM | Admit: 2012-05-25 | Discharge: 2012-05-25 | Disposition: A | Discharge status: Home Health | Payer: Self-pay | Attending: Emergency Medicine | Admitting: Emergency Medicine

## 2012-05-25 ENCOUNTER — Encounter (HOSPITAL_COMMUNITY): Payer: Self-pay | Admitting: Emergency Medicine

## 2012-05-25 ENCOUNTER — Emergency Department (HOSPITAL_COMMUNITY): Payer: Self-pay

## 2012-05-25 DIAGNOSIS — Y9383 Activity, rough housing and horseplay: Secondary | ICD-10-CM | POA: Insufficient documentation

## 2012-05-25 DIAGNOSIS — S60219A Contusion of unspecified wrist, initial encounter: Secondary | ICD-10-CM | POA: Insufficient documentation

## 2012-05-25 DIAGNOSIS — W2203XA Walked into furniture, initial encounter: Secondary | ICD-10-CM | POA: Insufficient documentation

## 2012-05-25 DIAGNOSIS — Y92009 Unspecified place in unspecified non-institutional (private) residence as the place of occurrence of the external cause: Secondary | ICD-10-CM | POA: Insufficient documentation

## 2012-05-25 DIAGNOSIS — S60211A Contusion of right wrist, initial encounter: Secondary | ICD-10-CM

## 2012-05-25 MED ORDER — IBUPROFEN 800 MG PO TABS: 800.0000 mg | ORAL_TABLET | Freq: Three times a day (TID) | ORAL | Status: AC | PRN

## 2012-05-25 MED ORDER — IBUPROFEN 800 MG PO TABS
800.0000 mg | ORAL_TABLET | Freq: Once | ORAL | Status: AC
  Administered 2012-05-25: 800 mg via ORAL
  Filled 2012-05-25: qty 1

## 2012-05-25 NOTE — ED Notes (Signed)
Plint applied by ortho.  Pt tolerated well.

## 2012-05-25 NOTE — ED Notes (Signed)
C/o R wrist pain and swelling.  States she was wrestling with fiance this morning around 11am and wrist got caught between wall and bed.  CMS intact.

## 2012-05-25 NOTE — Discharge Instructions (Signed)
Read the information below.  Follow the instructions as detailed below.  Use the splint as needed for comfort.  Follow up with your primary care provider.  If you need a primary care provider, please use the list below.  You may return to the ER at any time for worsening condition or any new symptoms that concern you.   Contusion A contusion is a deep bruise. Contusions happen when an injury causes bleeding under the skin. Signs of bruising include pain, puffiness (swelling), and discolored skin. The contusion may turn blue, purple, or yellow. HOME CARE   Put ice on the injured area.   Put ice in a plastic bag.   Place a towel between your skin and the bag.   Leave the ice on for 15 to 20 minutes, 3 to 4 times a day.   Only take medicine as told by your doctor.   Rest the injured area.   If possible, raise (elevate) the injured area to lessen puffiness.  GET HELP RIGHT AWAY IF:   You have more bruising or puffiness.   You have pain that is getting worse.   Your puffiness or pain is not helped by medicine.  MAKE SURE YOU:   Understand these instructions.   Will watch your condition.   Will get help right away if you are not doing well or get worse.  Document Released: 05/30/2008 Document Revised: 12/01/2011 Document Reviewed: 10/17/2011 [facility] Patient Information 2012 [place], [place].   If you have no primary doctor, here are some resources that may be helpful:  Medicaid-accepting [facility] Providers:   - [facility]- [address]      [phone]      Mon-Fri 9am-7pm, Sat 9am-1pm   - [facility]- [address] [place], [place]      [phone]   - [facility]- [address] [place]      [phone]   [facility]- [address]      [phone]   - Renaye Rakers- [address] [place], [address]      [phone]      Only accepts [place] patients       after  they have her name applied to their card   Self Pay (no insurance) in [place]:   - Sickle Cell Patients: Dr Willey Blade, [facility]      [address] [place]      [phone]   - [phone]   - Physician Referral Service- [phone]   - [facility]- [address] [place]      [phone]   [facility] [place]- [address]   - [facility]- see information above      (Speak to [employer] if you do not have insurance)   - [facility]- [address] [place]      [phone]   - [facility] [place]- [address]      [phone]   - [facility]- [address]      [phone]   - Dr Julio Sicks-  [address], [place]      [phone]   - [facility]- [address]      [phone]   - [facility]- [address]  [phone]      Also [address]      [phone]   - [facility]- [address]      [phone]      1st and 3rd Saturday every month, 10am-1pm Other agencies that provide inexpensive medical care:    [facility]  [phone]    [facility]  [phone]    [facility]  [phone]    [facility]  [phone]    [facility]  [phone]  General Information: Finding a doctor when you do not have health insurance can be tricky. Although you are not limited by an insurance plan, you are of course limited by her finances and how much but he can pay out of pocket.  What are your options if you don't have health insurance?   1) Find a Librarian, academic and Pay Out of Pocket Although you won't have to find out who is covered by your insurance plan, it is a good idea to ask around and get recommendations. You will then need to call the office and see if the doctor you have chosen will accept you as a new patient and what types of options they offer for patients who are self-pay. Some  doctors offer discounts or will set up payment plans for their patients who do not have insurance, but you will need to ask so you aren't surprised when you get to your appointment.  2) Contact Your Local Health Department Not all health departments have doctors that can see patients for sick visits, but many do, so it is worth a call to see if yours does. If you don't know where your local health department is, you can check in your phone book. The CDC also has a tool to help you locate your state's health department, and many state websites also have listings of all of their local health departments.  3) Find a Walk-in Clinic If your illness is not likely to be very severe or complicated, you may want to try a walk in clinic. These are popping up all over the country in pharmacies, drugstores, and shopping centers. They're usually staffed by nurse practitioners or physician assistants that have been trained to treat common illnesses and complaints. They're usually fairly quick and inexpensive. However, if you have serious medical issues or chronic medical problems, these are probably not your best option

## 2012-05-25 NOTE — ED Notes (Signed)
Ice pack applied for patient comfort.  

## 2012-05-25 NOTE — ED Notes (Signed)
Patient transported to X-ray 

## 2012-05-25 NOTE — ED Provider Notes (Signed)
History     CSN: [id number]  Arrival date & time 05/25/12  1521   First MD Initiated Contact with Patient 05/25/12 1532      Chief Complaint  Patient presents with  . Wrist Pain    (Consider location/radiation/quality/duration/timing/severity/associated sxs/prior treatment) HPI Comments: Patient reports pain in her right wrist and right hand, ulnar aspect, after play-wrestling with her fiance this morning and accidentally having her hand and wrist slammed between the bed and the wall.  Reports pain is constant, exacerbated with flexing fingers.  Denies change in sensation, ability to move fingers.  Denies other injury.  Denies that this was a result of anything but playfulness.    Patient is a 21 y.o. female presenting with wrist pain. The history is provided by the patient.  Wrist Pain This is a new problem. The current episode started today. Pertinent negatives include no numbness, rash or weakness.    Past Medical History  Diagnosis Date  . No pertinent past medical history     Past Surgical History  Procedure Date  . Adenoidectomy     Family History  Problem Relation Age of Onset  . Anesthesia problems Neg Hx   . Hypotension Neg Hx   . Malignant hyperthermia Neg Hx   . Pseudochol deficiency Neg Hx     History  Substance Use Topics  . Smoking status: Never Smoker   . Smokeless tobacco: Not on file  . Alcohol Use: No    OB History    Grav Para Term Preterm Abortions TAB SAB Ect Mult Living   1    1  1    0      Review of Systems  Skin: Negative for color change, rash and wound.  Neurological: Negative for weakness and numbness.    Allergies  Review of patient's allergies indicates no known allergies.  Home Medications  No current outpatient prescriptions on file.  BP 113/69  Pulse 91  Temp(Src) 98.6 F (37 C) (Oral)  Resp 16  SpO2 98%  LMP 03/24/2012  Physical Exam  Nursing note and vitals reviewed. Constitutional: She appears well-developed  and well-nourished. No distress.  HENT:  Head: Normocephalic and atraumatic.  Neck: Neck supple.  Pulmonary/Chest: Effort normal.  Musculoskeletal:       Right elbow: Normal.      Right wrist: She exhibits tenderness. She exhibits no crepitus, no deformity and no laceration.       Right hand: She exhibits tenderness and bony tenderness. She exhibits normal range of motion, normal capillary refill, no deformity and no laceration. normal sensation noted. Normal strength noted.       Hands:      Pt with full flexion and extension of all fingers, capillary refill < 2 seconds, sensation intact.  Tenderness along ulnar aspect of wrist and hand.  Skin intact.  Pt does have bandaid over index fingertip from "old" injury.    Neurological: She is alert.  Skin: She is not diaphoretic.  Old injury is very small healing laceration ~4mm to fingertip.    ED Course  Procedures (including critical care time)  Labs Reviewed - No data to display Dg Wrist Complete Right  05/25/2012  *RADIOLOGY REPORT*  Clinical Data: Injury, pain.  RIGHT WRIST - COMPLETE 3+ VIEW  Comparison: Plain films right wrist 10/09/2010.  Findings: Imaged bones, joints and soft tissues appear normal.  IMPRESSION: Negative exam.  Original Report Authenticated By: Bernadene Bell. D'ALESSIO, M.D.   Dg Hand Complete Right  05/25/2012  *  RADIOLOGY REPORT*  Clinical Data: Injury, pain.  RIGHT HAND - COMPLETE 3+ VIEW  Comparison: Plain films right hand 10/09/2010.  Findings: Imaged bones, joints and soft tissues appear normal.  IMPRESSION: Negative exam.  Original Report Authenticated By: Bernadene Bell. D'ALESSIO, M.D.     1. Contusion of right wrist       MDM  Pt with accidental injury to right wrist and hand today, tenderness along ulnar aspect.  Xrays are negative, hand and wrist are neurovascularly intact.  Pt d/c home with velcro splint for comfort, RICE instructions, PCP resources for follow up.  Patient verbalizes understanding and agrees  with plan.          Rise Patience, [place] 05/25/12 1659

## 2012-05-25 NOTE — Progress Notes (Signed)
Orthopedic Tech Progress Note Patient Details:  Angela Conrad 25-Dec-1991 [id number]  Ortho Devices Type of Ortho Device: Velcro wrist splint Ortho Device/Splint Location: right wrist Ortho Device/Splint Interventions: Application   Tisheena Maguire T 05/25/2012, 4:21 PM

## 2012-05-25 NOTE — ED Notes (Signed)
Returned from radiology. 

## 2012-05-26 NOTE — ED Provider Notes (Signed)
Medical screening examination/treatment/procedure(s) were performed by non-physician practitioner and as supervising physician I was immediately available for consultation/collaboration.   Dione Booze, MD 05/26/12 [phone]

## 2012-07-26 ENCOUNTER — Emergency Department (HOSPITAL_COMMUNITY)
Admission: EM | Admit: 2012-07-26 | Discharge: 2012-07-26 | Disposition: A | Discharge status: Home / Self Care | Payer: Self-pay | Attending: Emergency Medicine | Admitting: Emergency Medicine

## 2012-07-26 ENCOUNTER — Encounter (HOSPITAL_COMMUNITY): Payer: Self-pay | Admitting: Emergency Medicine

## 2012-07-26 DIAGNOSIS — T50995A Adverse effect of other drugs, medicaments and biological substances, initial encounter: Secondary | ICD-10-CM | POA: Insufficient documentation

## 2012-07-26 DIAGNOSIS — T7840XA Allergy, unspecified, initial encounter: Secondary | ICD-10-CM | POA: Insufficient documentation

## 2012-07-26 MED ORDER — ONDANSETRON 4 MG PO TBDP
4.0000 mg | ORAL_TABLET | Freq: Once | ORAL | Status: AC
  Administered 2012-07-26: 4 mg via ORAL
  Filled 2012-07-26: qty 1

## 2012-07-26 MED ORDER — EPINEPHRINE 0.3 MG/0.3ML IJ DEVI: 0.3000 mg | INTRAMUSCULAR | Status: DC | PRN

## 2012-07-26 MED ORDER — ONDANSETRON 4 MG PO TBDP: 4.0000 mg | ORAL_TABLET | Freq: Three times a day (TID) | ORAL | Status: AC | PRN

## 2012-07-26 NOTE — ED Notes (Signed)
Per EMS pt transported from residence after smelling mushrooms being cooked by roommate. 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 No s/s of anaphylaxis at this time. C/o nausea at this time.

## 2012-07-26 NOTE — ED Notes (Signed)
[id number]<AV> Expected date:<BR> Expected time:<BR> Means of arrival:<BR> Comments:<BR> EMS/allergic reaction to mushrooms

## 2012-07-26 NOTE — ED Provider Notes (Signed)
History     CSN: [id number]  Arrival date & time 07/26/12  0029   First MD Initiated Contact with Patient 07/26/12 0113      Chief Complaint  Patient presents with  . 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 female presenting with allergic reaction. The history is provided by the patient.  Allergic Reaction The primary symptoms are  nausea and vomiting. The primary symptoms do not include wheezing, shortness of breath or dizziness. The current episode started less than 1 hour ago.  Nausea began today.  The vomiting began today.    Past Medical History  Diagnosis Date  . No pertinent past medical history     Past Surgical History  Procedure Date  . Adenoidectomy     Family History  Problem Relation Age of Onset  . Anesthesia problems Neg Hx   . Hypotension Neg Hx   . Malignant hyperthermia Neg Hx   . Pseudochol deficiency Neg Hx     History  Substance Use Topics  . Smoking status: Never Smoker   . Smokeless tobacco: Not on file  . Alcohol Use: No    OB History    Grav Para Term Preterm Abortions TAB SAB Ect Mult Living   1    1  1    0      Review of Systems  Constitutional: Negative for fever and chills.  Respiratory: Negative for shortness of breath and wheezing.   Gastrointestinal: Positive for nausea and vomiting.  Neurological: Negative for dizziness, weakness and numbness.    Allergies  Other and Tomato  Home Medications  No current outpatient prescriptions on file.  BP 133/61  Pulse 83  Temp 98.6 F (37 C) (Oral)  Resp 18  SpO2 100%  Physical Exam  Constitutional: She is oriented to person, place, and time. She appears well-developed and  well-nourished.  HENT:  Head: Normocephalic.  Neck: Normal range of motion.  Cardiovascular: Normal rate.   Pulmonary/Chest: Effort normal.  Neurological: She is alert and oriented to person, place, and time.  Skin: Skin is warm. No rash noted.    ED Course  Procedures (including critical care time)  Labs Reviewed - No data to display No results found.   No diagnosis found.    MDM   Will treat for nausea  Patient also reports that she is out of her EpiPen         Arman Filter, NP 07/26/12 0144

## 2012-07-26 NOTE — ED Provider Notes (Signed)
Medical screening examination/treatment/procedure(s) were performed by non-physician practitioner and as supervising physician I was immediately available for consultation/collaboration.   Lyanne Co, MD 07/26/12 [phone]

## 2012-11-24 IMAGING — US US PELVIS COMPLETE
1 series · 14 of 25 positions shown · non-contrast
Comparison: 05/18/2010

CLINICAL DATA: Pelvic pain.  Left adnexal tenderness.



[Series 1: us pelvis complete · 0.28mm/px · 14 of 66 slices shown]
[im 1/66]
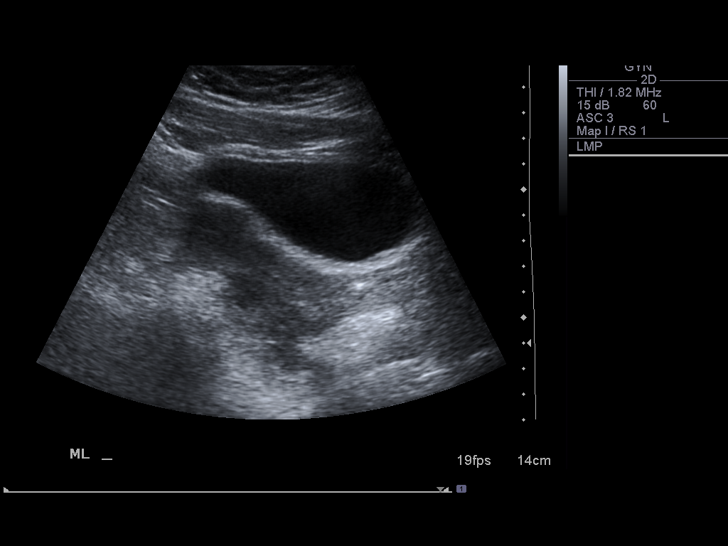
[im 6/66]
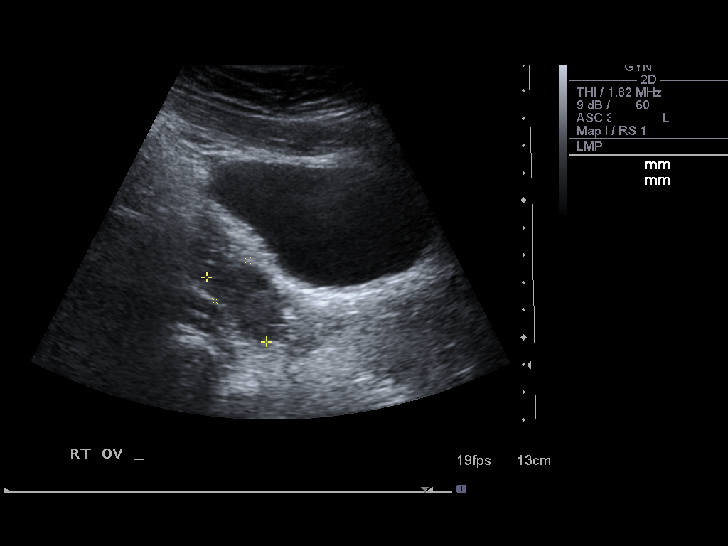
[im 11/66]
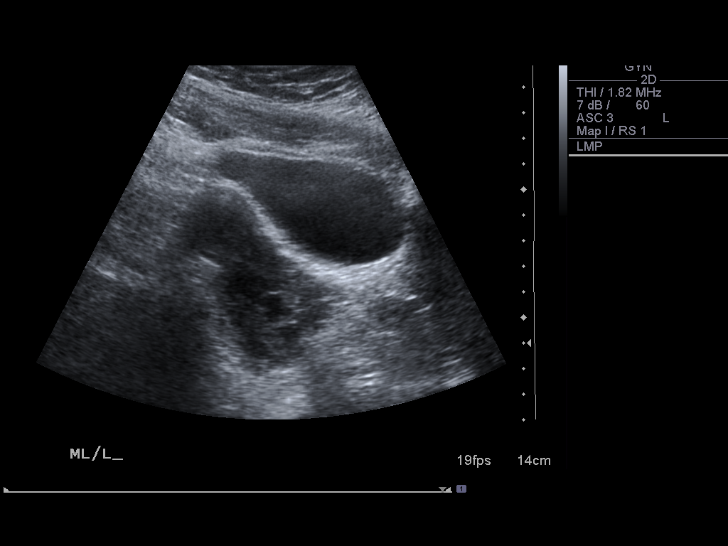
[im 17/66]
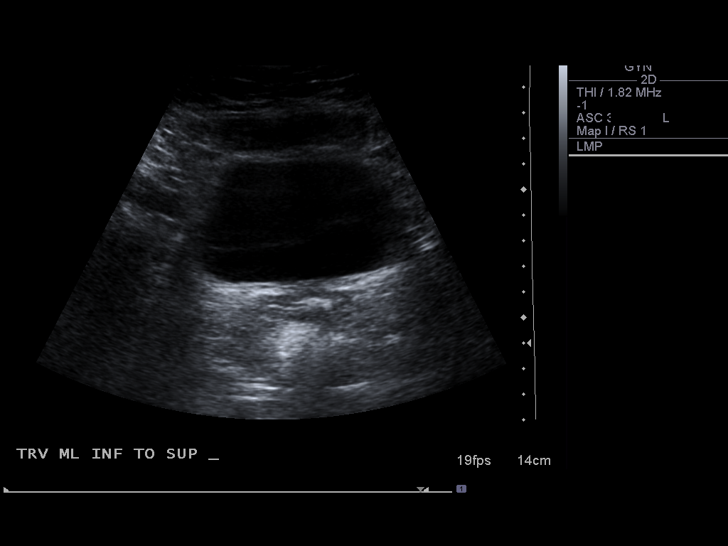
[im 22/66]
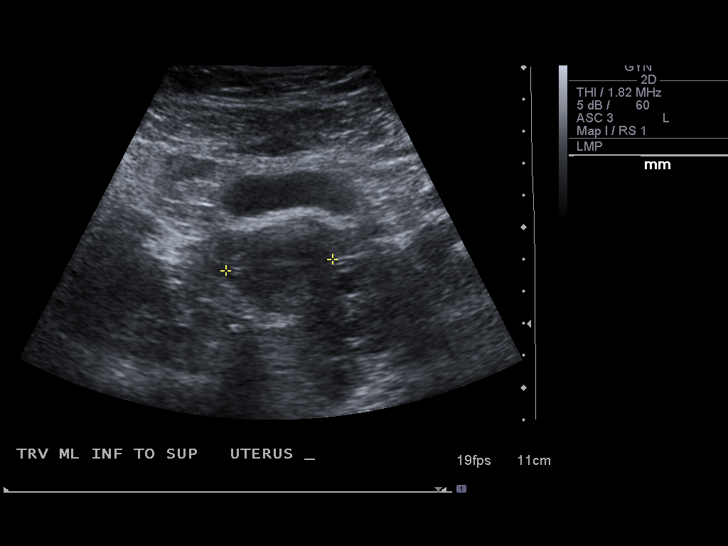
[im 25/66]
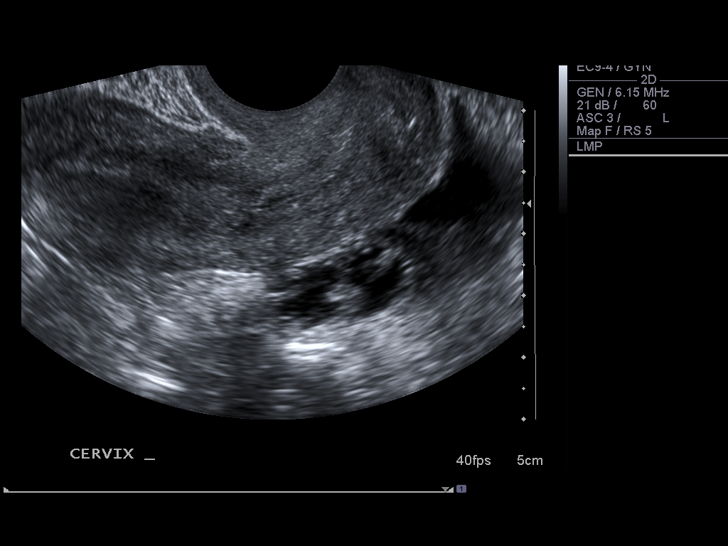
[im 30/66]
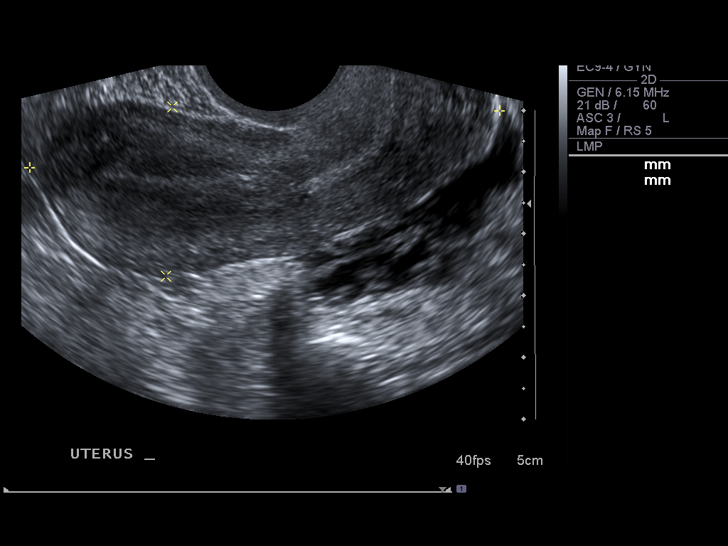
[im 36/66]
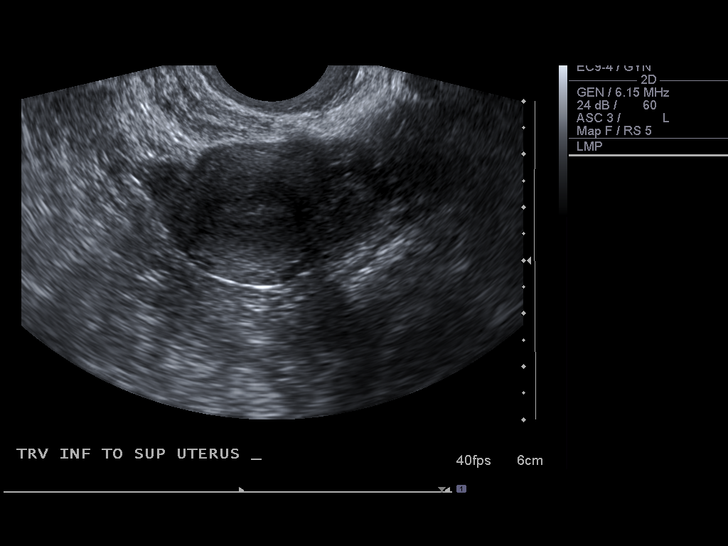
[im 41/66]
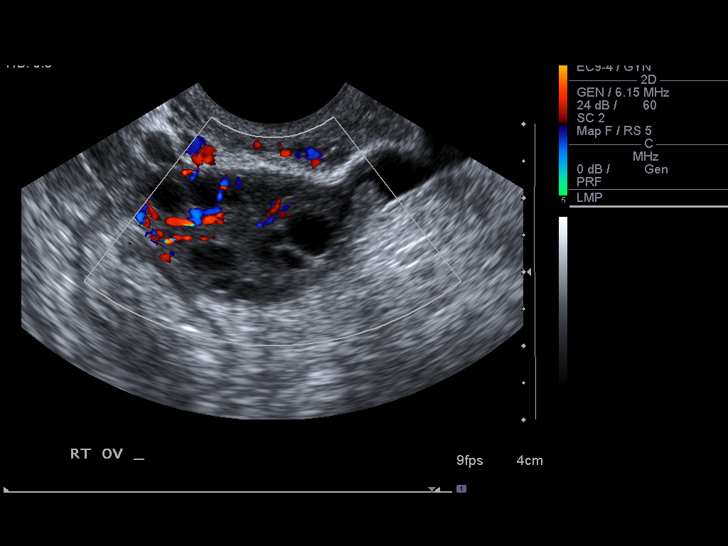
[im 44/66]
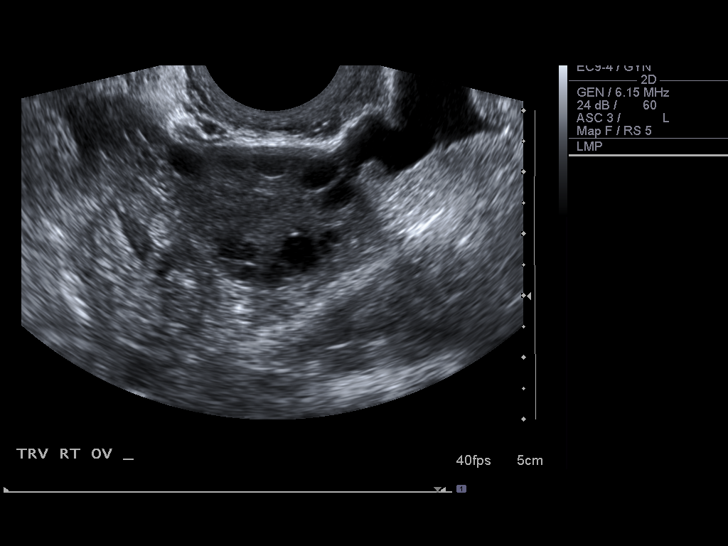
[im 49/66]
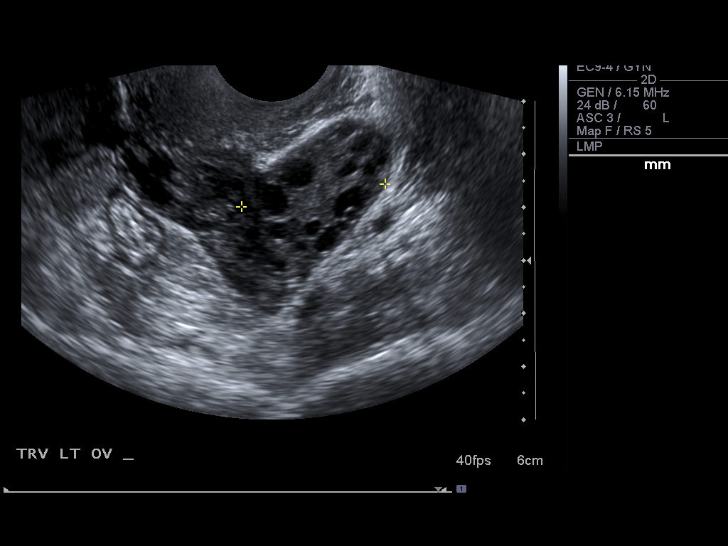
[im 55/66]
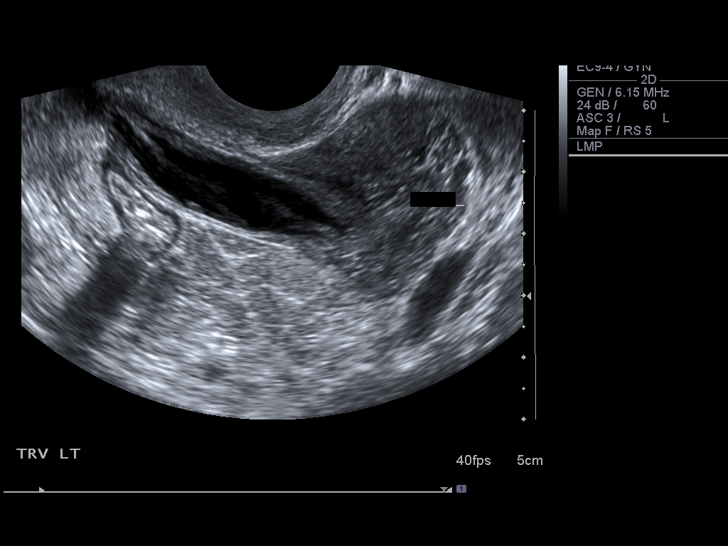
[im 60/66]
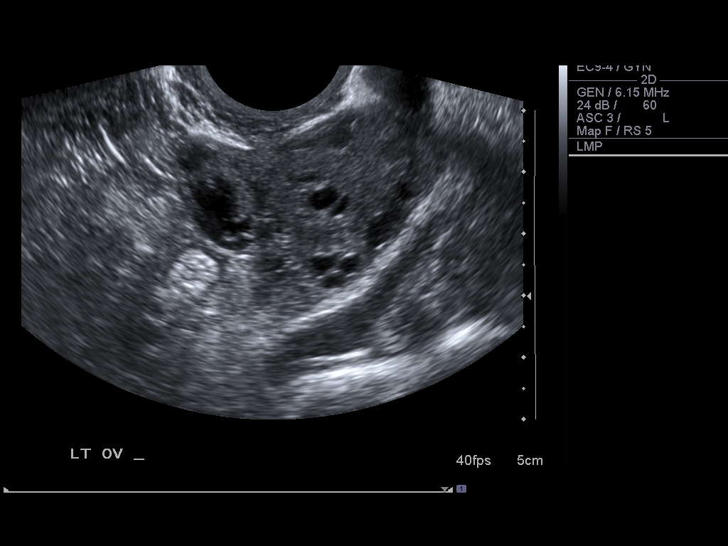
[im 66/66]
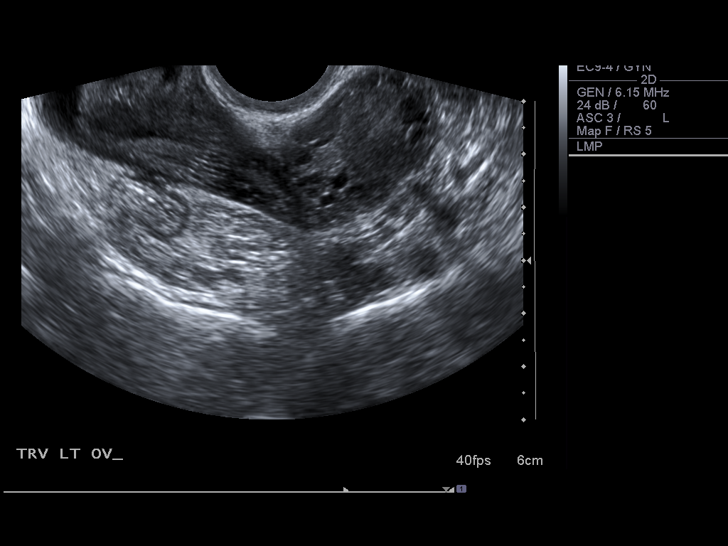

[14 of 25 positions shown; findings below may reference images not displayed]

FINDINGS: Uterus:  Normal in size and appearance

Endometrium: Normal in thickness and appearance.  Measures 5 mm
transvaginally.

Right ovary: Normal appearance/no adnexal mass

Left ovary: Normal appearance.  A small fluid-filled tubular
structure is seen in the left adnexa adjacent to the ovary,
suspicious for mild Billiot.

Other Findings:  Tiny amount of free fluid in the cul-de-sac.
IMPRESSION: 1.  Normal appearance of uterus and both ovaries.
2.  Mild Patrick Pajon Tifalin and tiny amount of free fluid.

## 2013-05-21 ENCOUNTER — Encounter (HOSPITAL_COMMUNITY): Payer: Self-pay | Admitting: Adult Health

## 2013-05-21 DIAGNOSIS — R1032 Left lower quadrant pain: Secondary | ICD-10-CM | POA: Insufficient documentation

## 2013-05-21 DIAGNOSIS — Z3202 Encounter for pregnancy test, result negative: Secondary | ICD-10-CM | POA: Insufficient documentation

## 2013-05-21 DIAGNOSIS — R109 Unspecified abdominal pain: Secondary | ICD-10-CM | POA: Insufficient documentation

## 2013-05-21 DIAGNOSIS — N920 Excessive and frequent menstruation with regular cycle: Secondary | ICD-10-CM | POA: Insufficient documentation

## 2013-05-21 NOTE — ED Notes (Signed)
Unable to find [facility]

## 2013-05-21 NOTE — ED Notes (Addendum)
Presents with vaginal bleeding that began as spotting and increased, pt reports small blood clots and lower abdominal pain. She states, "I am pregnant, but I don not know how far along I am. I just found out by taking a few home tests and they were positive. My period is irregular so that is not helpful. I took a test because I was waking up and throwing up. I had one spontaneous miscarriage before" pt denies vaginal bleeding at this time but reports severe lower abdominal pain. Pt appears to be much further along than one to two months.  Pain began earlier today described as sharp and stabbing that comes and goes

## 2013-05-21 NOTE — ED Notes (Signed)
PT. REQUESTING NO VISITORS AT THIS TIME.

## 2013-05-22 ENCOUNTER — Emergency Department (HOSPITAL_COMMUNITY)
Admission: EM | Admit: 2013-05-22 | Discharge: 2013-05-22 | Disposition: A | Discharge status: Home / Self Care | Payer: Self-pay | Attending: Emergency Medicine | Admitting: Emergency Medicine

## 2013-05-22 ENCOUNTER — Emergency Department (HOSPITAL_COMMUNITY): Payer: Self-pay

## 2013-05-22 DIAGNOSIS — N920 Excessive and frequent menstruation with regular cycle: Secondary | ICD-10-CM

## 2013-05-22 DIAGNOSIS — R109 Unspecified abdominal pain: Secondary | ICD-10-CM

## 2013-05-22 LAB — URINALYSIS, ROUTINE W REFLEX MICROSCOPIC
Ketones, ur: 15 mg/dL — AB
Leukocytes, UA: NEGATIVE
Nitrite: NEGATIVE
Protein, ur: NEGATIVE mg/dL

## 2013-05-22 LAB — WET PREP, GENITAL

## 2013-05-22 LAB — ABO/RH

## 2013-05-22 LAB — CBC
MCHC: 33.6 g/dL (ref 30.0–36.0)
RBC: 4.58 MIL/uL (ref 3.87–5.11)

## 2013-05-22 LAB — TYPE AND SCREEN: ABO/RH(D): O POS

## 2013-05-22 MED ORDER — HYDROCODONE-ACETAMINOPHEN 5-325 MG PO TABS: 1.0000 | ORAL_TABLET | ORAL | Status: DC | PRN

## 2013-05-22 MED ORDER — OXYCODONE-ACETAMINOPHEN 5-325 MG PO TABS
1.0000 | ORAL_TABLET | Freq: Once | ORAL | Status: AC
  Administered 2013-05-22: 1 via ORAL
  Filled 2013-05-22: qty 1

## 2013-05-22 NOTE — ED Provider Notes (Signed)
History     CSN: [id number]  Arrival date & time 05/21/13  2248   First MD Initiated Contact with Patient 05/22/13 0140      Chief Complaint  Patient presents with  . Vaginal Bleeding    (Consider location/radiation/quality/duration/timing/severity/associated sxs/prior treatment) HPI Comments: Angela Conrad is a 22 y.o. female who presents for evaluation of abdominal pain and vaginal bleeding. This problem started today. The abdominal pain is felt in the pelvic area and is crampy in nature. The bleeding is, like a menses. She denies weakness, dizziness, nausea, or vomiting. She has had positive pregnancy test at home and it pregnancy care Center. She has spontaneous abortion 1, and a half years ago. There've been no other pregnancies. She denies vomiting, or diarrhea. She has had mild nausea for several days. There are no known modifying factors.  Patient is a 22 y.o. female presenting with vaginal bleeding.  Vaginal Bleeding   Past Medical History  Diagnosis Date  . No pertinent past medical history     Past Surgical History  Procedure Laterality Date  . Adenoidectomy      Family History  Problem Relation Age of Onset  . Anesthesia problems Neg Hx   . Hypotension Neg Hx   . Malignant hyperthermia Neg Hx   . Pseudochol deficiency Neg Hx     History  Substance Use Topics  . Smoking status: Never Smoker   . Smokeless tobacco: Not on file  . Alcohol Use: No    OB History   Grav Para Term Preterm Abortions TAB SAB Ect Mult Living   1    1  1    0      Review of Systems  Genitourinary: Positive for vaginal bleeding.    Allergies  Other and Tomato  Home Medications   Current Outpatient Rx  Name  Route  Sig  Dispense  Refill  . diphenhydrAMINE (BENADRYL) 25 mg capsule   Oral   Take 25 mg by mouth every 6 (six) hours as needed for itching.         [name] EPINEPHrine (EPIPEN) 0.3 mg/0.3 mL DEVI   Intramuscular   Inject 0.3 mLs (0.3 mg total) into the muscle as  needed.   1 Device   0   . HYDROcodone-acetaminophen (NORCO) 5-325 MG per tablet   Oral   Take 1 tablet by mouth every 4 (four) hours as needed for pain.   10 tablet   0     BP 118/70  Pulse 94  Temp(Src) 99.5 F (37.5 C) (Oral)  Resp 16  SpO2 97%  Physical Exam  Nursing note and vitals reviewed. Constitutional: She is oriented to person, place, and time. She appears well-developed.  Obese  HENT:  Head: Normocephalic and atraumatic.  Eyes: Conjunctivae and EOM are normal. Pupils are equal, round, and reactive to light.  Neck: Normal range of motion and phonation normal. Neck supple.  Cardiovascular: Normal rate, regular rhythm and intact distal pulses.   Pulmonary/Chest: Effort normal and breath sounds normal. She exhibits no tenderness.  Abdominal: Soft. She exhibits distension and mass (left lower quadrant- without discrete edges, possibly 20 cm.). There is no tenderness. There is no rebound and no guarding.  Genitourinary:  Normal external female genitalia. No vaginal discharge, or bleeding. On bimanual examination there is tenderness in the left adnexa, without palpable mass or enlarged ovary. The uterus was not palpable. There is no right adnexal tenderness.  Musculoskeletal: Normal range of motion.  Neurological: She is alert  and oriented to person, place, and time. She has normal strength. She exhibits normal muscle tone.  Skin: Skin is warm and dry.  Psychiatric: She has a normal mood and affect. Her behavior is normal. Judgment and thought content normal.    ED Course  Procedures (including critical care time)  Medications  oxyCODONE-acetaminophen (PERCOCET/ROXICET) 5-325 MG per tablet 1 tablet (1 tablet Oral Given 05/22/13 0319)    Patient Vitals for the past 24 hrs:  BP Temp Temp src Pulse Resp SpO2  05/22/13 0511 118/70 mmHg - - 94 16 97 %  05/22/13 0320 118/70 mmHg - - - 16 98 %  05/22/13 0200 - - - 93 - 100 %  05/22/13 0100 - - - 88 - 100 %  05/21/13  2300 135/73 mmHg 99.5 F (37.5 C) Oral 116 16 99 %   4:55 AM Reevaluation with update and discussion. After initial assessment and treatment, an updated evaluation reveals she feels better. She denies abdominal pain. Abdomen Reexamination: No left lower quadrant mass. Minimal suprapubic tenderness. [name]    Labs Reviewed  WET PREP, GENITAL - Abnormal; Notable for the following:    Clue Cells Wet Prep HPF POC FEW (*)    WBC, Wet Prep HPF POC FEW (*)    All other components within normal limits  CBC - Abnormal; Notable for the following:    WBC 13.0 (*)    All other components within normal limits  URINALYSIS, ROUTINE W REFLEX MICROSCOPIC - Abnormal; Notable for the following:    Ketones, ur 15 (*)    All other components within normal limits  GC/CHLAMYDIA PROBE AMP  HCG, QUANTITATIVE, PREGNANCY  POCT PREGNANCY, URINE  ABO/RH  TYPE AND SCREEN   US Transvaginal Non-ob  05/22/2013   *RADIOLOGY REPORT*  Clinical Data: Vaginal bleeding for several days.  TRANSABDOMINAL AND TRANSVAGINAL ULTRASOUND OF PELVIS Technique:  Both transabdominal and transvaginal ultrasound examinations of the pelvis were performed. Transabdominal technique was performed for global imaging of the pelvis including uterus, ovaries, adnexal regions, and pelvic cul-de-sac.  It was necessary to proceed with endovaginal exam following the transabdominal exam to visualize the uterus and ovaries in greater detail.  Comparison:  Pelvic ultrasound performed 04/01/2012  Findings:  Uterus: Normal in size and appearance; measures 6.7 x 2.8 x 3.9 cm.  Endometrium: Normal in thickness and appearance; measures 0.3 cm in thickness.  Right ovary:  Normal appearance/no adnexal mass; measures 3.4 x 2.3 x 2.9 cm.  Left ovary: Normal appearance/no adnexal mass; measures 3.2 x 2.7 x 2.9 cm.  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  Limited Doppler evaluation demonstrates  normal color Doppler blood flow with respect to both ovaries; there is no evidence for ovarian torsion.  Other findings: No free fluid is seen within the pelvic cul-de-sac.  IMPRESSION: Normal study.  No evidence of pelvic mass or other significant abnormality.  Left-sided hydrosalpinx again noted.   Original Report Authenticated By: Tonia Ghent, M.D.   US Pelvis Complete  05/22/2013   *RADIOLOGY REPORT*  Clinical Data: Vaginal bleeding for several days.  TRANSABDOMINAL AND TRANSVAGINAL ULTRASOUND OF PELVIS Technique:  Both transabdominal and transvaginal ultrasound examinations of the pelvis were performed. Transabdominal technique was performed for global imaging of the pelvis including uterus, ovaries, adnexal regions, and pelvic cul-de-sac.  It was necessary to proceed with endovaginal exam following the transabdominal exam to visualize the uterus and ovaries in greater detail.  Comparison:  Pelvic  ultrasound performed 04/01/2012  Findings:  Uterus: Normal in size and appearance; measures 6.7 x 2.8 x 3.9 cm.  Endometrium: Normal in thickness and appearance; measures 0.3 cm in thickness.  Right ovary:  Normal appearance/no adnexal mass; measures 3.4 x 2.3 x 2.9 cm.  Left ovary: Normal appearance/no adnexal mass; measures 3.2 x 2.7 x 2.9 cm.  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  Limited Doppler evaluation demonstrates normal color Doppler blood flow with respect to both ovaries; there is no evidence for ovarian torsion.  Other findings: No free fluid is seen within the pelvic cul-de-sac.  IMPRESSION: Normal study.  No evidence of pelvic mass or other significant abnormality.  Left-sided hydrosalpinx again noted.   Original Report Authenticated By: Tonia Ghent, M.D.     1. Menorrhagia   2. Abdominal pain       MDM  Nonspecific abdominal pain, with reassuring evaluation in the emergency department. She is not pregnant. There is no  apparent ovarian mass, pelvic, mass or uterine abnormality. Her abdominal exam improved with treatment in the emergency department. I doubt colitis, intestinal obstruction, or intra-abdominal tumor. Doubt metabolic instability, serious bacterial infection or impending vascular collapse; the patient is stable for discharge.  Nursing Notes Reviewed/ Care Coordinated, and agree without changes. Applicable Imaging Reviewed.  Interpretation of Laboratory Data incorporated into ED treatment   Plan: Home Medications- Norco #10; Home Treatments- rest, fluids; Recommended follow up- GYN followup regarding irregular menses        Flint Melter, MD 05/22/13 [phone]

## 2013-05-29 ENCOUNTER — Telehealth (HOSPITAL_COMMUNITY): Payer: Self-pay | Admitting: Emergency Medicine

## 2013-07-02 ENCOUNTER — Inpatient Hospital Stay (HOSPITAL_COMMUNITY)
Admission: AD | Admit: 2013-07-02 | Discharge: 2013-07-03 | Disposition: A | Discharge status: Home / Self Care | Payer: Self-pay | Source: Ambulatory Visit | Attending: Obstetrics & Gynecology | Admitting: Obstetrics & Gynecology

## 2013-07-02 DIAGNOSIS — R109 Unspecified abdominal pain: Secondary | ICD-10-CM | POA: Insufficient documentation

## 2013-07-02 DIAGNOSIS — N390 Urinary tract infection, site not specified: Secondary | ICD-10-CM

## 2013-07-02 DIAGNOSIS — N949 Unspecified condition associated with female genital organs and menstrual cycle: Secondary | ICD-10-CM | POA: Insufficient documentation

## 2013-07-03 ENCOUNTER — Encounter (HOSPITAL_COMMUNITY): Payer: Self-pay | Admitting: *Deleted

## 2013-07-03 DIAGNOSIS — N390 Urinary tract infection, site not specified: Secondary | ICD-10-CM

## 2013-07-03 LAB — URINALYSIS, ROUTINE W REFLEX MICROSCOPIC
Leukocytes, UA: NEGATIVE
Nitrite: NEGATIVE
Specific Gravity, Urine: >1.03 — ABNORMAL HIGH (ref 1.005–1.030)
pH: 6 (ref 5.0–8.0)

## 2013-07-03 LAB — URINE MICROSCOPIC-ADD ON

## 2013-07-03 MED ORDER — KETOROLAC TROMETHAMINE 60 MG/2ML IM SOLN: 60.0000 mg | Freq: Once | INTRAMUSCULAR | Status: DC

## 2013-07-03 MED ORDER — CIPROFLOXACIN HCL 500 MG PO TABS: 500.0000 mg | ORAL_TABLET | Freq: Two times a day (BID) | ORAL | Status: DC

## 2013-07-03 MED ORDER — PHENAZOPYRIDINE HCL 200 MG PO TABS: 200.0000 mg | ORAL_TABLET | Freq: Three times a day (TID) | ORAL | Status: DC | PRN

## 2013-07-03 NOTE — MAU Note (Signed)
PT  SAYS SHE HAS   DARK REDDISH / BROWN D/C-    STARTED  THIS AM-  .   TOOK OFF PAD- - D/C STOPPED.  CRAMPING STARTED AT 4 PM-   FEELS NOW  LIKE THEN-  THOUGHT HAD A UTI-   SAYS PRONE  TO UTI.      NO BIRTH CONTROL.  LAST SEX-    END OF June.

## 2013-07-03 NOTE — MAU Provider Note (Signed)
Chief Complaint: No chief complaint on file.   First Provider Initiated Contact with Patient 07/03/13 [phone]     SUBJECTIVE HPI: Angela Conrad is a 22 y.o. G2P0020 nonpregnant female who came to MAU with her friend reporting intermittent low abd pain 3/10 on pain scale, frequency, dysuria and vaginal spotting x 2-3 days. Feels like UTI or possibly ovarians cyst that she has had in the past. Patient's last menstrual period was 06/19/2013. patient is sexually active and not using birth control.   Past Medical History  Diagnosis Date  . No pertinent past medical history    OB History   Grav Para Term Preterm Abortions TAB SAB Ect Mult Living   2    2  2    0     # Outc Date GA Lbr Len/2nd Wgt Sex Del Anes PTL Lv   1 SAB            2 SAB              Past Surgical History  Procedure Laterality Date  . Adenoidectomy     History   Social History  . Marital Status: Single    Spouse Name: N/A    Number of Children: N/A  . Years of Education: N/A   Occupational History  . Not on file.   Social History Main Topics  . Smoking status: Never Smoker   . Smokeless tobacco: Not on file  . Alcohol Use: 0.6 oz/week    1 Glasses of wine per week     Comment: 1 GLASS  OCC::      . Drug Use: No  . Sexually Active: Yes    Birth Control/ Protection: None   Other Topics Concern  . Not on file   Social History Narrative  . No narrative on file   No current facility-administered medications on file prior to encounter.   Current Outpatient Prescriptions on File Prior to Encounter  Medication Sig Dispense Refill  . diphenhydrAMINE (BENADRYL) 25 mg capsule Take 25 mg by mouth every 6 (six) hours as needed for itching.      [name] EPINEPHrine (EPIPEN) 0.3 mg/0.3 mL DEVI Inject 0.3 mLs (0.3 mg total) into the muscle as needed.  1 Device  0  . HYDROcodone-acetaminophen (NORCO) 5-325 MG per tablet Take 1 tablet by mouth every 4 (four) hours as needed for pain.  10 tablet  0   Allergies  Allergen  Reactions  . Other Other (See Comments)    mushroom  . Tomato Other (See Comments)    Unknown reaction    ROS: Pertinent items in HPI  OBJECTIVE Blood pressure 110/69, pulse 70, temperature 98.5 F (36.9 C), temperature source Oral, height 5\' 2"  (1.575 m), weight 92.704 kg (204 lb 6 oz), last menstrual period 06/19/2013. GENERAL: Well-developed, well-nourished female in no acute distress.  HEENT: Normocephalic HEART: normal rate RESP: normal effort ABDOMEN: Soft, non-tender EXTREMITIES: Nontender, no edema NEURO: Alert and oriented SPECULUM EXAM: Declined  LAB RESULTS Results for orders placed during the hospital encounter of 07/02/13 (from the past 24 hour(s))  POCT PREGNANCY, URINE     Status: None   Collection Time    07/03/13  3:39 AM      Result Value Range   Preg Test, Ur NEGATIVE  NEGATIVE  URINALYSIS, ROUTINE W REFLEX MICROSCOPIC     Status: Abnormal   Collection Time    07/03/13  3:40 AM      Result Value Range   Color,  Urine YELLOW  YELLOW   APPearance CLEAR  CLEAR   Specific Gravity, Urine >1.030 (*) 1.005 - 1.030   pH 6.0  5.0 - 8.0   Glucose, UA NEGATIVE  NEGATIVE mg/dL   Hgb urine dipstick MODERATE (*) NEGATIVE   Bilirubin Urine NEGATIVE  NEGATIVE   Ketones, ur NEGATIVE  NEGATIVE mg/dL   Protein, ur NEGATIVE  NEGATIVE mg/dL   Urobilinogen, UA 0.2  0.0 - 1.0 mg/dL   Nitrite NEGATIVE  NEGATIVE   Leukocytes, UA NEGATIVE  NEGATIVE  URINE MICROSCOPIC-ADD ON     Status: None   Collection Time    07/03/13  3:40 AM      Result Value Range   Squamous Epithelial / LPF RARE  RARE   WBC, UA 0-2  <3 WBC/hpf   RBC / HPF 3-6  <3 RBC/hpf   Urine-Other MUCOUS PRESENT      IMAGING No results found.  MAU COURSE Declined GC Chlamydia, wet prep. Negative on 05/14/2013.  ASSESSMENT 1. UTI (urinary tract infection)     PLAN Discharge home in stable condition. Increase water.     Follow-up Information   Follow up with Gynecologist . (if no improvement in  three days)        Medication List         ciprofloxacin 500 MG tablet  Commonly known as:  CIPRO  Take 1 tablet (500 mg total) by mouth 2 (two) times daily.     diphenhydrAMINE 25 mg capsule  Commonly known as:  BENADRYL  Take 25 mg by mouth every 6 (six) hours as needed for itching.     EPINEPHrine 0.3 mg/0.3 mL Devi  Commonly known as:  EPIPEN  Inject 0.3 mLs (0.3 mg total) into the muscle as needed.     HYDROcodone-acetaminophen 5-325 MG per tablet  Commonly known as:  NORCO  Take 1 tablet by mouth every 4 (four) hours as needed for pain.     phenazopyridine 200 MG tablet  Commonly known as:  PYRIDIUM  Take 1 tablet (200 mg total) by mouth 3 (three) times daily as needed for pain.        [place], CNM 07/03/2013  4:28 AM

## 2014-06-27 ENCOUNTER — Encounter (HOSPITAL_COMMUNITY): Payer: Self-pay | Admitting: Emergency Medicine

## 2014-06-27 ENCOUNTER — Emergency Department (HOSPITAL_COMMUNITY): Payer: No Typology Code available for payment source

## 2014-06-27 ENCOUNTER — Emergency Department (HOSPITAL_COMMUNITY)
Admission: EM | Admit: 2014-06-27 | Discharge: 2014-06-27 | Disposition: A | Discharge status: Home / Self Care | Payer: No Typology Code available for payment source | Attending: Emergency Medicine | Admitting: Emergency Medicine

## 2014-06-27 DIAGNOSIS — S99929A Unspecified injury of unspecified foot, initial encounter: Secondary | ICD-10-CM

## 2014-06-27 DIAGNOSIS — S8990XA Unspecified injury of unspecified lower leg, initial encounter: Secondary | ICD-10-CM | POA: Insufficient documentation

## 2014-06-27 DIAGNOSIS — T148XXA Other injury of unspecified body region, initial encounter: Secondary | ICD-10-CM | POA: Insufficient documentation

## 2014-06-27 DIAGNOSIS — S4980XA Other specified injuries of shoulder and upper arm, unspecified arm, initial encounter: Secondary | ICD-10-CM | POA: Insufficient documentation

## 2014-06-27 DIAGNOSIS — S99919A Unspecified injury of unspecified ankle, initial encounter: Secondary | ICD-10-CM

## 2014-06-27 DIAGNOSIS — S298XXA Other specified injuries of thorax, initial encounter: Secondary | ICD-10-CM | POA: Insufficient documentation

## 2014-06-27 DIAGNOSIS — Y92410 Unspecified street and highway as the place of occurrence of the external cause: Secondary | ICD-10-CM | POA: Insufficient documentation

## 2014-06-27 DIAGNOSIS — S46909A Unspecified injury of unspecified muscle, fascia and tendon at shoulder and upper arm level, unspecified arm, initial encounter: Secondary | ICD-10-CM | POA: Insufficient documentation

## 2014-06-27 DIAGNOSIS — IMO0002 Reserved for concepts with insufficient information to code with codable children: Secondary | ICD-10-CM | POA: Insufficient documentation

## 2014-06-27 DIAGNOSIS — Y9389 Activity, other specified: Secondary | ICD-10-CM | POA: Insufficient documentation

## 2014-06-27 LAB — CBC WITH DIFFERENTIAL/PLATELET
Basophils Absolute: 0 10*3/uL (ref 0.0–0.1)
Basophils Relative: 0 % (ref 0–1)
Eosinophils Absolute: 0 10*3/uL (ref 0.0–0.7)
Eosinophils Relative: 0 % (ref 0–5)
HCT: 42.2 % (ref 36.0–46.0)
Hemoglobin: 13.9 g/dL (ref 12.0–15.0)
LYMPHS ABS: 2.1 10*3/uL (ref 0.7–4.0)
LYMPHS PCT: 19 % (ref 12–46)
MCH: 30.3 pg (ref 26.0–34.0)
MCHC: 32.9 g/dL (ref 30.0–36.0)
MCV: 91.9 fL (ref 78.0–100.0)
Monocytes Absolute: 0.5 10*3/uL (ref 0.1–1.0)
Monocytes Relative: 4 % (ref 3–12)
NEUTROS PCT: 77 % (ref 43–77)
Neutro Abs: 8.6 10*3/uL — ABNORMAL HIGH (ref 1.7–7.7)
Platelets: 326 10*3/uL (ref 150–400)
RBC: 4.59 MIL/uL (ref 3.87–5.11)
RDW: 12.5 % (ref 11.5–15.5)
WBC: 11.2 10*3/uL — ABNORMAL HIGH (ref 4.0–10.5)

## 2014-06-27 LAB — COMPREHENSIVE METABOLIC PANEL
ALK PHOS: 95 U/L (ref 39–117)
ALT: 42 U/L — AB (ref 0–35)
AST: 41 U/L — ABNORMAL HIGH (ref 0–37)
Albumin: 3.7 g/dL (ref 3.5–5.2)
Anion gap: 11 (ref 5–15)
BUN: 14 mg/dL (ref 6–23)
CALCIUM: 9.4 mg/dL (ref 8.4–10.5)
CO2: 26 meq/L (ref 19–32)
Chloride: 104 mEq/L (ref 96–112)
Creatinine, Ser: 0.69 mg/dL (ref 0.50–1.10)
GLUCOSE: 104 mg/dL — AB (ref 70–99)
POTASSIUM: 3.9 meq/L (ref 3.7–5.3)
Sodium: 141 mEq/L (ref 137–147)
TOTAL PROTEIN: 7.2 g/dL (ref 6.0–8.3)
Total Bilirubin: 0.3 mg/dL (ref 0.3–1.2)

## 2014-06-27 LAB — PROTIME-INR
INR: 0.98 (ref 0.00–1.49)
Prothrombin Time: 13 seconds (ref 11.6–15.2)

## 2014-06-27 LAB — ETHANOL

## 2014-06-27 MED ORDER — HYDROCODONE-ACETAMINOPHEN 5-325 MG PO TABS: 1.0000 | ORAL_TABLET | ORAL | Status: DC | PRN

## 2014-06-27 MED ORDER — IBUPROFEN 600 MG PO TABS: 600.0000 mg | ORAL_TABLET | Freq: Four times a day (QID) | ORAL | Status: DC | PRN

## 2014-06-27 MED ORDER — ONDANSETRON HCL 4 MG/2ML IJ SOLN
4.0000 mg | Freq: Once | INTRAMUSCULAR | Status: AC
  Administered 2014-06-27: 4 mg via INTRAVENOUS
  Filled 2014-06-27: qty 2

## 2014-06-27 MED ORDER — IOHEXOL 300 MG/ML  SOLN
80.0000 mL | Freq: Once | INTRAMUSCULAR | Status: AC | PRN
  Administered 2014-06-27: 80 mL via INTRAVENOUS

## 2014-06-27 MED ORDER — HYDROMORPHONE HCL PF 1 MG/ML IJ SOLN
1.0000 mg | Freq: Once | INTRAMUSCULAR | Status: AC
  Administered 2014-06-27: 1 mg via INTRAVENOUS
  Filled 2014-06-27: qty 1

## 2014-06-27 MED ORDER — FENTANYL CITRATE 0.05 MG/ML IJ SOLN
100.0000 ug | Freq: Once | INTRAMUSCULAR | Status: AC
  Administered 2014-06-27: 100 ug via INTRAVENOUS
  Filled 2014-06-27: qty 2

## 2014-06-27 NOTE — ED Provider Notes (Signed)
Medical screening examination/treatment/procedure(s) were performed by non-physician practitioner and as supervising physician I was immediately available for consultation/collaboration.    Lissa Rowles L Janea Schwenn, MD 06/27/14 1619 
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
## 2014-06-27 NOTE — Discharge Instructions (Signed)
Contusion °A contusion is a deep bruise. Contusions are the result of an injury that caused bleeding under the skin. The contusion may turn blue, purple, or yellow. Minor injuries will give you a painless contusion, but more severe contusions may stay painful and swollen for a few weeks.  °CAUSES  °A contusion is usually caused by a blow, trauma, or direct force to an area of the body. °SYMPTOMS  °· Swelling and redness of the injured area. °· Bruising of the injured area. °· Tenderness and soreness of the injured area. °· Pain. °DIAGNOSIS  °The diagnosis can be made by taking a history and physical exam. An X-ray, CT scan, or MRI may be needed to determine if there were any associated injuries, such as fractures. °TREATMENT  °Specific treatment will depend on what area of the body was injured. In general, the best treatment for a contusion is resting, icing, elevating, and applying cold compresses to the injured area. Over-the-counter medicines may also be recommended for pain control. Ask your caregiver what the best treatment is for your contusion. °HOME CARE INSTRUCTIONS  °· Put ice on the injured area. °¨ Put ice in a plastic bag. °¨ Place a towel between your skin and the bag. °¨ Leave the ice on for 15-20 minutes, 3-4 times a day, or as directed by your health care provider. °· Only take over-the-counter or prescription medicines for pain, discomfort, or fever as directed by your caregiver. Your caregiver may recommend avoiding anti-inflammatory medicines (aspirin, ibuprofen, and naproxen) for 48 hours because these medicines may increase bruising. °· Rest the injured area. °· If possible, elevate the injured area to reduce swelling. °SEEK IMMEDIATE MEDICAL CARE IF:  °· You have increased bruising or swelling. °· You have pain that is getting worse. °· Your swelling or pain is not relieved with medicines. °MAKE SURE YOU:  °· Understand these instructions. °· Will watch your condition. °· Will get help right  away if you are not doing well or get worse. °Document Released: 09/21/2005 Document Revised: 12/17/2013 Document Reviewed: 10/17/2011 °ExitCare® Patient Information ©2015 [employer]. This information is not intended to replace advice given to you by your health care provider. Make sure you discuss any questions you have with your health care provider. ° °

## 2014-06-28 NOTE — ED Provider Notes (Signed)
I saw and evaluated the patient, reviewed the resident's note and I agree with the findings and plan.  X-rays negative, discharged on OTC medications.  Dione Boozeavid Shanera Meske, MD 06/28/14 [phone]

## 2014-08-15 ENCOUNTER — Emergency Department (INDEPENDENT_AMBULATORY_CARE_PROVIDER_SITE_OTHER)
Admission: EM | Admit: 2014-08-15 | Discharge: 2014-08-15 | Disposition: A | Discharge status: Home / Self Care | Payer: PRIVATE HEALTH INSURANCE | Source: Home / Self Care | Attending: Family Medicine | Admitting: Family Medicine

## 2014-08-15 ENCOUNTER — Encounter (HOSPITAL_COMMUNITY): Payer: Self-pay | Admitting: Emergency Medicine

## 2014-08-15 DIAGNOSIS — S46811A Strain of other muscles, fascia and tendons at shoulder and upper arm level, right arm, initial encounter: Secondary | ICD-10-CM

## 2014-08-15 DIAGNOSIS — S46819A Strain of other muscles, fascia and tendons at shoulder and upper arm level, unspecified arm, initial encounter: Secondary | ICD-10-CM

## 2014-08-15 DIAGNOSIS — S4381XA Sprain of other specified parts of right shoulder girdle, initial encounter: Secondary | ICD-10-CM

## 2014-08-15 MED ORDER — DICLOFENAC SODIUM 50 MG PO TBEC: 50.0000 mg | DELAYED_RELEASE_TABLET | Freq: Two times a day (BID) | ORAL | Status: DC | PRN

## 2014-08-15 NOTE — Discharge Instructions (Signed)
Thank you for coming in today. Follow up with the sports medicine center.  Take diclofenac for pain as needed Do the shoulder exercises we talked about.  Rotator Cuff Injury Rotator cuff injury is any type of injury to the set of muscles and tendons that make up the stabilizing unit of your shoulder. This unit holds the ball of your upper arm bone (humerus) in the socket of your shoulder blade (scapula).  CAUSES Injuries to your rotator cuff most commonly come from sports or activities that cause your arm to be moved repeatedly over your head. Examples of this include throwing, weight lifting, swimming, or racquet sports. Long lasting (chronic) irritation of your rotator cuff can cause soreness and swelling (inflammation), bursitis, and eventual damage to your tendons, such as a tear (rupture). SIGNS AND SYMPTOMS Acute rotator cuff tear:  Sudden tearing sensation followed by severe pain shooting from your upper shoulder down your arm toward your elbow.  Decreased range of motion of your shoulder because of pain and muscle spasm.  Severe pain.  Inability to raise your arm out to the side because of pain and loss of muscle power (large tears). Chronic rotator cuff tear:  Pain that usually is worse at night and may interfere with sleep.  Gradual weakness and decreased shoulder motion as the pain worsens.  Decreased range of motion. Rotator cuff tendinitis:  Deep ache in your shoulder and the outside upper arm over your shoulder.  Pain that comes on gradually and becomes worse when lifting your arm to the side or turning it inward. DIAGNOSIS Rotator cuff injury is diagnosed through a medical history, physical exam, and imaging exam. The medical history helps determine the type of rotator cuff injury. Your health care provider will look at your injured shoulder, feel the injured area, and ask you to move your shoulder in different positions. X-ray exams typically are done to rule out  other causes of shoulder pain, such as fractures. MRI is the exam of choice for the most severe shoulder injuries because the images show muscles and tendons.  TREATMENT  Chronic tear:  Medicine for pain, such as acetaminophen or ibuprofen.  Physical therapy and range-of-motion exercises may be helpful in maintaining shoulder function and strength.  Steroid injections into your shoulder joint.  Surgical repair of the rotator cuff if the injury does not heal with noninvasive treatment. Acute tear:  Anti-inflammatory medicines such as ibuprofen and naproxen to help reduce pain and swelling.  A sling to help support your arm and rest your rotator cuff muscles. Long-term use of a sling is not advised. It may cause significant stiffening of the shoulder joint.  Surgery may be considered within a few weeks, especially in younger, active people, to return the shoulder to full function.  Indications for surgical treatment include the following:  Age younger than 60 years.  Rotator cuff tears that are complete.  Physical therapy, rest, and anti-inflammatory medicines have been used for 6-8 weeks, with no improvement.  Employment or sporting activity that requires constant shoulder use. Tendinitis:  Anti-inflammatory medicines such as ibuprofen and naproxen to help reduce pain and swelling.  A sling to help support your arm and rest your rotator cuff muscles. Long-term use of a sling is not advised. It may cause significant stiffening of the shoulder joint.  Severe tendinitis may require:  Steroid injections into your shoulder joint.  Physical therapy.  Surgery. HOME CARE INSTRUCTIONS   Apply ice to your injury:  Put ice in a  plastic bag.  Place a towel between your skin and the bag.  Leave the ice on for 20 minutes, 2-3 times a day.  If you have a shoulder immobilizer (sling and straps), wear it until told otherwise by your health care provider.  You may want to sleep on  several pillows or in a recliner at night to lessen swelling and pain.  Only take over-the-counter or prescription medicines for pain, discomfort, or fever as directed by your health care provider.  Do simple hand squeezing exercises with a soft rubber ball to decrease hand swelling. SEEK MEDICAL CARE IF:   Your shoulder pain increases, or new pain or numbness develops in your arm, hand, or fingers.  Your hand or fingers are colder than your other hand. SEEK IMMEDIATE MEDICAL CARE IF:   Your arm, hand, or fingers are numb or tingling.  Your arm, hand, or fingers are increasingly swollen and painful, or they turn white or blue. MAKE SURE YOU:  Understand these instructions.  Will watch your condition.  Will get help right away if you are not doing well or get worse. Document Released: 12/09/2000 Document Revised: 12/17/2013 Document Reviewed: 07/24/2013 [facility] Patient Information 2015 [place], [place]. This information is not intended to replace advice given to you by your health care provider. Make sure you discuss any questions you have with your health care provider.
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
## 2014-10-27 ENCOUNTER — Encounter (HOSPITAL_COMMUNITY): Payer: Self-pay | Admitting: Emergency Medicine

## 2015-01-11 ENCOUNTER — Inpatient Hospital Stay (HOSPITAL_COMMUNITY)
Admission: AD | Admit: 2015-01-11 | Discharge: 2015-01-12 | Disposition: A | Discharge status: Home / Self Care | Payer: Self-pay | Source: Ambulatory Visit | Attending: Obstetrics & Gynecology | Admitting: Obstetrics & Gynecology

## 2015-01-11 DIAGNOSIS — N7011 Chronic salpingitis: Secondary | ICD-10-CM | POA: Insufficient documentation

## 2015-01-11 DIAGNOSIS — R1032 Left lower quadrant pain: Secondary | ICD-10-CM | POA: Insufficient documentation

## 2015-01-11 LAB — URINE MICROSCOPIC-ADD ON: WBC, UA: NONE SEEN WBC/hpf (ref <3)

## 2015-01-11 LAB — URINALYSIS, ROUTINE W REFLEX MICROSCOPIC
BILIRUBIN URINE: NEGATIVE
GLUCOSE, UA: NEGATIVE mg/dL
Ketones, ur: NEGATIVE mg/dL
LEUKOCYTES UA: NEGATIVE
Nitrite: NEGATIVE
PH: 6.5 (ref 5.0–8.0)
PROTEIN: NEGATIVE mg/dL
SPECIFIC GRAVITY, URINE: 1.025 (ref 1.005–1.030)
UROBILINOGEN UA: 1 mg/dL (ref 0.0–1.0)

## 2015-01-11 LAB — CBC
HCT: 40.3 % (ref 36.0–46.0)
HEMOGLOBIN: 12.8 g/dL (ref 12.0–15.0)
MCH: 29.1 pg (ref 26.0–34.0)
MCHC: 31.8 g/dL (ref 30.0–36.0)
MCV: 91.6 fL (ref 78.0–100.0)
Platelets: 303 10*3/uL (ref 150–400)
RBC: 4.4 MIL/uL (ref 3.87–5.11)
RDW: 12.2 % (ref 11.5–15.5)
WBC: 12.3 10*3/uL — AB (ref 4.0–10.5)

## 2015-01-11 LAB — WET PREP, GENITAL
Trich, Wet Prep: NONE SEEN
Yeast Wet Prep HPF POC: NONE SEEN

## 2015-01-11 LAB — POCT PREGNANCY, URINE: Preg Test, Ur: NEGATIVE

## 2015-01-11 MED ORDER — KETOROLAC TROMETHAMINE 60 MG/2ML IM SOLN
60.0000 mg | Freq: Once | INTRAMUSCULAR | Status: AC
Start: 2015-01-11 — End: 2015-01-11
  Administered 2015-01-11: 60 mg via INTRAMUSCULAR
  Filled 2015-01-11: qty 2

## 2015-01-11 NOTE — MAU Note (Signed)
Pt reports pain and pressure in left lower abd for 3 days, nausea and vomiting today.

## 2015-01-11 NOTE — MAU Provider Note (Signed)
History     CSN: [id number]  Arrival date and time: 01/11/15 2236   First Provider Initiated Contact with Patient 01/11/15 2327      Chief Complaint  Patient presents with  . Abdominal Pain  . Emesis   HPI  Angela Conrad is a 24 y.o. G2P0020 who presents today with LLQ pain. She states that the pain started 3 days ago. She has tried tylenol and aleve, but neither have helped. She has not taken anything today. She rates her pain 9/10. She states that she has also had nausea and vomiting today. She denies fever, diarrhea or sick contacts.   Past Medical History  Diagnosis Date  . No pertinent past medical history     Past Surgical History  Procedure Laterality Date  . Adenoidectomy      Family History  Problem Relation Age of Onset  . Anesthesia problems Neg Hx   . Hypotension Neg Hx   . Malignant hyperthermia Neg Hx   . Pseudochol deficiency Neg Hx     History  Substance Use Topics  . Smoking status: Never Smoker   . Smokeless tobacco: Not on file  . Alcohol Use: No     Comment: 1 GLASS  OCC::        Allergies:  Allergies  Allergen Reactions  . Other Other (See Comments)    mushroom  . Tomato Other (See Comments)    Unknown reaction    Prescriptions prior to admission  Medication Sig Dispense Refill Last Dose  . diclofenac (VOLTAREN) 50 MG EC tablet Take 1 tablet (50 mg total) by mouth 2 (two) times daily as needed. 30 tablet 0     ROS Physical Exam   Blood pressure 137/86, pulse 86, temperature 98.7 F (37.1 C), temperature source Oral, resp. rate 18, height 5\' 3"  (1.6 m), weight 98.884 kg (218 lb), last menstrual period 10/14/2014, SpO2 100 %.  Physical Exam  Nursing note and vitals reviewed. Constitutional: She is oriented to person, place, and time. She appears well-developed and well-nourished. No distress.  Cardiovascular: Normal rate and regular rhythm.   Respiratory: Effort normal.  GI: Soft. There is no tenderness. There is no rebound.   Genitourinary:  External: no lesion Vagina: small amount of white discharge Cervix: pink, smooth, no CMT Uterus: NSSC Adnexa: NT   Neurological: She is alert and oriented to person, place, and time.  Skin: Skin is warm and dry.  Psychiatric: She has a normal mood and affect.    MAU Course  Procedures Results for orders placed or performed during the hospital encounter of 01/11/15 (from the past 24 hour(s))  Urinalysis, Routine w reflex microscopic     Status: Abnormal   Collection Time: 01/11/15 11:04 PM  Result Value Ref Range   Color, Urine YELLOW YELLOW   APPearance CLEAR CLEAR   Specific Gravity, Urine 1.025 1.005 - 1.030   pH 6.5 5.0 - 8.0   Glucose, UA NEGATIVE NEGATIVE mg/dL   Hgb urine dipstick TRACE (A) NEGATIVE   Bilirubin Urine NEGATIVE NEGATIVE   Ketones, ur NEGATIVE NEGATIVE mg/dL   Protein, ur NEGATIVE NEGATIVE mg/dL   Urobilinogen, UA 1.0 0.0 - 1.0 mg/dL   Nitrite NEGATIVE NEGATIVE   Leukocytes, UA NEGATIVE NEGATIVE  Urine microscopic-add on     Status: None   Collection Time: 01/11/15 11:04 PM  Result Value Ref Range   Squamous Epithelial / LPF RARE RARE   WBC, UA  <3 WBC/hpf    NO FORMED ELEMENTS SEEN  ON URINE MICROSCOPIC EXAMINATION   RBC / HPF 0-2 <3 RBC/hpf   Bacteria, UA RARE RARE  Pregnancy, urine POC     Status: None   Collection Time: 01/11/15 11:12 PM  Result Value Ref Range   Preg Test, Ur NEGATIVE NEGATIVE  Wet prep, genital     Status: Abnormal   Collection Time: 01/11/15 11:30 PM  Result Value Ref Range   Yeast Wet Prep HPF POC NONE SEEN NONE SEEN   Trich, Wet Prep NONE SEEN NONE SEEN   Clue Cells Wet Prep HPF POC FEW (A) NONE SEEN   WBC, Wet Prep HPF POC FEW (A) NONE SEEN  CBC     Status: Abnormal   Collection Time: 01/11/15 11:40 PM  Result Value Ref Range   WBC 12.3 (H) 4.0 - 10.5 K/uL   RBC 4.40 3.87 - 5.11 MIL/uL   Hemoglobin 12.8 12.0 - 15.0 g/dL   HCT 16.1 09.6 - 04.5 %   MCV 91.6 78.0 - 100.0 fL   MCH 29.1 26.0 - 34.0 pg    MCHC 31.8 30.0 - 36.0 g/dL   RDW 40.9 81.1 - 91.4 %   Platelets 303 150 - 400 K/uL  Comprehensive metabolic panel     Status: Abnormal   Collection Time: 01/11/15 11:40 PM  Result Value Ref Range   Sodium 139 135 - 145 mmol/L   Potassium 3.9 3.5 - 5.1 mmol/L   Chloride 105 96 - 112 mEq/L   CO2 30 19 - 32 mmol/L   Glucose, Bld 127 (H) 70 - 99 mg/dL   BUN 17 6 - 23 mg/dL   Creatinine, Ser 7.82 0.50 - 1.10 mg/dL   Calcium 9.0 8.4 - 95.6 mg/dL   Total Protein 6.9 6.0 - 8.3 g/dL   Albumin 3.7 3.5 - 5.2 g/dL   AST 23 0 - 37 U/L   ALT 28 0 - 35 U/L   Alkaline Phosphatase 95 39 - 117 U/L   Total Bilirubin 0.3 0.3 - 1.2 mg/dL   GFR calc non Af Amer >90 >90 mL/min   GFR calc Af Amer >90 >90 mL/min   Anion gap 4 (L) 5 - 15  Amylase     Status: None   Collection Time: 01/11/15 11:40 PM  Result Value Ref Range   Amylase 77 0 - 105 U/L  Lipase, blood     Status: None   Collection Time: 01/11/15 11:40 PM  Result Value Ref Range   Lipase 34 11 - 59 U/L   US Transvaginal Non-ob  01/12/2015   CLINICAL DATA:  Left-sided pelvic pain. Negative urinary pregnancy test. History of left hydrosalpinx.  EXAM: TRANSABDOMINAL AND TRANSVAGINAL ULTRASOUND OF PELVIS  TECHNIQUE: Both transabdominal and transvaginal ultrasound examinations of the pelvis were performed. Transabdominal technique was performed for global imaging of the pelvis including uterus, ovaries, adnexal regions, and pelvic cul-de-sac. It was necessary to proceed with endovaginal exam following the transabdominal exam to visualize the uterus, ovaries and adnexa to better advantage.  COMPARISON:  05/22/2013.  FINDINGS: Uterus  Measurements: 6.2 cm x 3 cm x 4 cm. Uterus is retroverted. No fibroids or other mass visualized.  Endometrium  Thickness: 14 mm. No endometrial mass or obvious fluid. Movement within the endometrium was evident on examination. This may be due to uterine contractions, but is nonspecific.  Right ovary  Measurements: 3.2 cm x  2.2 cm x 2.9 cm. Normal appearance/no adnexal mass.  Left ovary  Measurements: 3.6 cm x 2.2 cm x  2.8 cm. Mildly dilated tubular structure near the left ovary is consistent with a small left hydrosalpinx. No adnexal mass.  Other findings  Trace amount of cul-de-sac free fluid, presumed physiologic.  IMPRESSION: 1. No acute findings.  No findings to explain pelvic pain. 2. Small left hydrosalpinx. 3. Normal ovaries.  Trace pelvic free fluid, likely physiologic.   Electronically Signed   By: Amie Portland M.D.   On: 01/12/2015 01:19   US Pelvis Complete  01/12/2015   CLINICAL DATA:  Left-sided pelvic pain. Negative urinary pregnancy test. History of left hydrosalpinx.  EXAM: TRANSABDOMINAL AND TRANSVAGINAL ULTRASOUND OF PELVIS  TECHNIQUE: Both transabdominal and transvaginal ultrasound examinations of the pelvis were performed. Transabdominal technique was performed for global imaging of the pelvis including uterus, ovaries, adnexal regions, and pelvic cul-de-sac. It was necessary to proceed with endovaginal exam following the transabdominal exam to visualize the uterus, ovaries and adnexa to better advantage.  COMPARISON:  05/22/2013.  FINDINGS: Uterus  Measurements: 6.2 cm x 3 cm x 4 cm. Uterus is retroverted. No fibroids or other mass visualized.  Endometrium  Thickness: 14 mm. No endometrial mass or obvious fluid. Movement within the endometrium was evident on examination. This may be due to uterine contractions, but is nonspecific.  Right ovary  Measurements: 3.2 cm x 2.2 cm x 2.9 cm. Normal appearance/no adnexal mass.  Left ovary  Measurements: 3.6 cm x 2.2 cm x 2.8 cm. Mildly dilated tubular structure near the left ovary is consistent with a small left hydrosalpinx. No adnexal mass.  Other findings  Trace amount of cul-de-sac free fluid, presumed physiologic.  IMPRESSION: 1. No acute findings.  No findings to explain pelvic pain. 2. Small left hydrosalpinx. 3. Normal ovaries.  Trace pelvic free fluid, likely  physiologic.   Electronically Signed   By: Amie Portland M.D.   On: 01/12/2015 01:19   Left hydrosalpinx has been noted on [place] since 05/18/2010.  D/W Dr. Despina Hidden, will treat with doxycycline and rocephin.   Assessment and Plan   1. Hydrosalpinx   2. Acute left lower quadrant pain    DC home 250 mg rocephin IM given here  doxycycline BID x 14 days Return to MAU as needed FU with the clinic  Follow-up Information    Follow up with [facility].   Specialty:  Obstetrics and Gynecology   Why:  They will call you with an appointment    Contact information:   [address] [place] [address] [phone]       Tawnya Crook 01/11/2015, 11:34 PM

## 2015-01-12 ENCOUNTER — Inpatient Hospital Stay (HOSPITAL_COMMUNITY): Payer: PRIVATE HEALTH INSURANCE

## 2015-01-12 DIAGNOSIS — N7011 Chronic salpingitis: Secondary | ICD-10-CM

## 2015-01-12 LAB — COMPREHENSIVE METABOLIC PANEL
ALK PHOS: 95 U/L (ref 39–117)
ALT: 28 U/L (ref 0–35)
AST: 23 U/L (ref 0–37)
Albumin: 3.7 g/dL (ref 3.5–5.2)
Anion gap: 4 — ABNORMAL LOW (ref 5–15)
BILIRUBIN TOTAL: 0.3 mg/dL (ref 0.3–1.2)
BUN: 17 mg/dL (ref 6–23)
CALCIUM: 9 mg/dL (ref 8.4–10.5)
CO2: 30 mmol/L (ref 19–32)
Chloride: 105 mEq/L (ref 96–112)
Creatinine, Ser: 0.63 mg/dL (ref 0.50–1.10)
GFR calc non Af Amer: >90 mL/min (ref >90)
GLUCOSE: 127 mg/dL — AB (ref 70–99)
POTASSIUM: 3.9 mmol/L (ref 3.5–5.1)
Sodium: 139 mmol/L (ref 135–145)
TOTAL PROTEIN: 6.9 g/dL (ref 6.0–8.3)

## 2015-01-12 LAB — GC/CHLAMYDIA PROBE AMP (~~LOC~~) NOT AT ARMC
Chlamydia: NEGATIVE
NEISSERIA GONORRHEA: NEGATIVE

## 2015-01-12 LAB — AMYLASE: Amylase: 77 U/L (ref 0–105)

## 2015-01-12 LAB — LIPASE, BLOOD: LIPASE: 34 U/L (ref 11–59)

## 2015-01-12 MED ORDER — CEFTRIAXONE SODIUM 250 MG IJ SOLR
250.0000 mg | Freq: Once | INTRAMUSCULAR | Status: AC
  Administered 2015-01-12: 250 mg via INTRAMUSCULAR
  Filled 2015-01-12: qty 250

## 2015-01-12 MED ORDER — DOXYCYCLINE HYCLATE 50 MG PO CAPS: 100.0000 mg | ORAL_CAPSULE | Freq: Two times a day (BID) | ORAL | Status: DC

## 2015-01-12 NOTE — Discharge Instructions (Signed)
Pelvic Inflammatory Disease °Pelvic inflammatory disease (PID) refers to an infection in some or all of the female organs. The infection can be in the uterus, ovaries, fallopian tubes, or the surrounding tissues in the pelvis. PID can cause abdominal or pelvic pain that comes on suddenly (acute pelvic pain). PID is a serious infection because it can lead to lasting (chronic) pelvic pain or the inability to have children (infertile).  °CAUSES  °The infection is often caused by the normal bacteria found in the vaginal tissues. PID may also be caused by an infection that is spread during sexual contact. PID can also occur following:  °· The birth of a baby.   °· A miscarriage.   °· An abortion.   °· Major pelvic surgery.   °· The use of an intrauterine device (IUD).   °· A sexual assault.   °RISK FACTORS °Certain factors can put a person at higher risk for PID, such as: °· Being younger than 25 years. °· Being sexually active at a young age. °· Using nonbarrier contraception. °· Having multiple sexual partners. °· Having sex with someone who has symptoms of a genital infection. °· Using oral contraception. °Other times, certain behaviors can increase the possibility of getting PID, such as: °· Having sex during your period. °· Using a vaginal douche. °· Having an intrauterine device (IUD) in place. °SYMPTOMS  °· Abdominal or pelvic pain.   °· Fever.   °· Chills.   °· Abnormal vaginal discharge. °· Abnormal uterine bleeding.   °· Unusual pain shortly after finishing your period. °DIAGNOSIS  °Your caregiver will choose some of the following methods to make a diagnosis, such as:  °· Performing a physical exam and history. A pelvic exam typically reveals a very tender uterus and surrounding pelvis.   °· Ordering laboratory tests including a pregnancy test, blood tests, and urine test.  °· Ordering cultures of the vagina and cervix to check for a sexually transmitted infection (STI). °· Performing an ultrasound.    °· Performing a laparoscopic procedure to look inside the pelvis.   °TREATMENT  °· Antibiotic medicines may be prescribed and taken by mouth.   °· Sexual partners may be treated when the infection is caused by a sexually transmitted disease (STD).   °· Hospitalization may be needed to give antibiotics intravenously. °· Surgery may be needed, but this is rare. °It may take weeks until you are completely well. If you are diagnosed with PID, you should also be checked for human immunodeficiency virus (HIV).   °HOME CARE INSTRUCTIONS  °· If given, take your antibiotics as directed. Finish the medicine even if you start to feel better.   °· Only take over-the-counter or prescription medicines for pain, discomfort, or fever as directed by your caregiver.   °· Do not have sexual intercourse until treatment is completed or as directed by your caregiver. If PID is confirmed, your recent sexual partner(s) will need treatment.   °· Keep your follow-up appointments. °SEEK MEDICAL CARE IF:  °· You have increased or abnormal vaginal discharge.   °· You need prescription medicine for your pain.   °· You vomit.   °· You cannot take your medicines.   °· Your partner has an STD.   °SEEK IMMEDIATE MEDICAL CARE IF:  °· You have a fever.   °· You have increased abdominal or pelvic pain.   °· You have chills.   °· You have pain when you urinate.   °· You are not better after 72 hours following treatment.   °MAKE SURE YOU:  °· Understand these instructions. °· Will watch your condition. °· Will get help right away if you are not doing well or get worse. °  Document Released: 12/12/2005 Document Revised: 04/08/2013 Document Reviewed: 12/08/2011 °[employer]® Patient Information ©2015 [employer]. This information is not intended to replace advice given to you by your health care provider. Make sure you discuss any questions you have with your health care provider. ° °

## 2015-01-13 LAB — HIV ANTIBODY (ROUTINE TESTING W REFLEX)
HIV 1/HIV 2 AB: NONREACTIVE
HIV 1/O/2 Abs-Index Value: <1 (ref <1.00)

## 2015-02-09 ENCOUNTER — Encounter: Payer: Self-pay | Admitting: Nurse Practitioner

## 2015-02-13 ENCOUNTER — Encounter: Payer: Self-pay | Admitting: Obstetrics & Gynecology

## 2015-05-21 ENCOUNTER — Encounter (HOSPITAL_COMMUNITY): Payer: Self-pay | Admitting: Emergency Medicine

## 2015-05-21 ENCOUNTER — Emergency Department (HOSPITAL_COMMUNITY): Payer: Self-pay

## 2015-05-21 ENCOUNTER — Emergency Department (HOSPITAL_COMMUNITY)
Admission: EM | Admit: 2015-05-21 | Discharge: 2015-05-21 | Disposition: A | Discharge status: Home / Self Care | Payer: Self-pay | Attending: Emergency Medicine | Admitting: Emergency Medicine

## 2015-05-21 DIAGNOSIS — J069 Acute upper respiratory infection, unspecified: Secondary | ICD-10-CM | POA: Insufficient documentation

## 2015-05-21 DIAGNOSIS — Z79899 Other long term (current) drug therapy: Secondary | ICD-10-CM | POA: Insufficient documentation

## 2015-05-21 HISTORY — DX: Bronchitis, not specified as acute or chronic: J40

## 2015-05-21 MED ORDER — ALBUTEROL SULFATE HFA 108 (90 BASE) MCG/ACT IN AERS
2.0000 | INHALATION_SPRAY | RESPIRATORY_TRACT | Status: DC
  Administered 2015-05-21: 2 via RESPIRATORY_TRACT
  Filled 2015-05-21: qty 6.7

## 2015-05-21 MED ORDER — HYDROCODONE-HOMATROPINE 5-1.5 MG/5ML PO SYRP: 5.0000 mL | ORAL_SOLUTION | Freq: Four times a day (QID) | ORAL | Status: DC | PRN

## 2015-05-21 NOTE — Discharge Instructions (Signed)
Upper Respiratory Infection, Adult °Follow-up with a primary care provider using the resource guide below. °An upper respiratory infection (URI) is also sometimes known as the common cold. The upper respiratory tract includes the nose, sinuses, throat, trachea, and bronchi. Bronchi are the airways leading to the lungs. Most people improve within 1 week, but symptoms can last up to 2 weeks. A residual cough may last even longer.  °CAUSES °Many different viruses can infect the tissues lining the upper respiratory tract. The tissues become irritated and inflamed and often become very moist. Mucus production is also common. A cold is contagious. You can easily spread the virus to others by oral contact. This includes kissing, sharing a glass, coughing, or sneezing. Touching your mouth or nose and then touching a surface, which is then touched by another person, can also spread the virus. °SYMPTOMS  °Symptoms typically develop 1 to 3 days after you come in contact with a cold virus. Symptoms vary from person to person. They may include: °· Runny nose. °· Sneezing. °· Nasal congestion. °· Sinus irritation. °· Sore throat. °· Loss of voice (laryngitis). °· Cough. °· Fatigue. °· Muscle aches. °· Loss of appetite. °· Headache. °· Low-grade fever. °DIAGNOSIS  °You might diagnose your own cold based on familiar symptoms, since most people get a cold 2 to 3 times a year. Your caregiver can confirm this based on your exam. Most importantly, your caregiver can check that your symptoms are not due to another disease such as strep throat, sinusitis, pneumonia, asthma, or epiglottitis. Blood tests, throat tests, and X-rays are not necessary to diagnose a common cold, but they may sometimes be helpful in excluding other more serious diseases. Your caregiver will decide if any further tests are required. °RISKS AND COMPLICATIONS  °You may be at risk for a more severe case of the common cold if you smoke cigarettes, have chronic heart  disease (such as heart failure) or lung disease (such as asthma), or if you have a weakened immune system. The very young and very old are also at risk for more serious infections. Bacterial sinusitis, middle ear infections, and bacterial pneumonia can complicate the common cold. The common cold can worsen asthma and chronic obstructive pulmonary disease (COPD). Sometimes, these complications can require emergency medical care and may be life-threatening. °PREVENTION  °The best way to protect against getting a cold is to practice good hygiene. Avoid oral or hand contact with people with cold symptoms. Wash your hands often if contact occurs. There is no clear evidence that vitamin C, vitamin E, echinacea, or exercise reduces the chance of developing a cold. However, it is always recommended to get plenty of rest and practice good nutrition. °TREATMENT  °Treatment is directed at relieving symptoms. There is no cure. Antibiotics are not effective, because the infection is caused by a virus, not by bacteria. Treatment may include: °· Increased fluid intake. Sports drinks offer valuable electrolytes, sugars, and fluids. °· Breathing heated mist or steam (vaporizer or shower). °· Eating chicken soup or other clear broths, and maintaining good nutrition. °· Getting plenty of rest. °· Using gargles or lozenges for comfort. °· Controlling fevers with ibuprofen or acetaminophen as directed by your caregiver. °· Increasing usage of your inhaler if you have asthma. °Zinc gel and zinc lozenges, taken in the first 24 hours of the common cold, can shorten the duration and lessen the severity of symptoms. Pain medicines may help with fever, muscle aches, and throat pain. A variety of non-prescription   medicines are available to treat congestion and runny nose. Your caregiver can make recommendations and may suggest nasal or lung inhalers for other symptoms.  °HOME CARE INSTRUCTIONS  °· Only take over-the-counter or prescription  medicines for pain, discomfort, or fever as directed by your caregiver. °· Use a warm mist humidifier or inhale steam from a shower to increase air moisture. This may keep secretions moist and make it easier to breathe. °· Drink enough water and fluids to keep your urine clear or pale yellow. °· Rest as needed. °· Return to work when your temperature has returned to normal or as your caregiver advises. You may need to stay home longer to avoid infecting others. You can also use a face mask and careful hand washing to prevent spread of the virus. °SEEK MEDICAL CARE IF:  °· After the first few days, you feel you are getting worse rather than better. °· You need your caregiver's advice about medicines to control symptoms. °· You develop chills, worsening shortness of breath, or brown or red sputum. These may be signs of pneumonia. °· You develop yellow or brown nasal discharge or pain in the face, especially when you bend forward. These may be signs of sinusitis. °· You develop a fever, swollen neck glands, pain with swallowing, or white areas in the back of your throat. These may be signs of strep throat. °SEEK IMMEDIATE MEDICAL CARE IF:  °· You have a fever. °· You develop severe or persistent headache, ear pain, sinus pain, or chest pain. °· You develop wheezing, a prolonged cough, cough up blood, or have a change in your usual mucus (if you have chronic lung disease). °· You develop sore muscles or a stiff neck. °Document Released: 06/07/2001 Document Revised: 03/05/2012 Document Reviewed: 03/19/2014 °ExitCare® Patient Information ©2015 [employer]. This information is not intended to replace advice given to you by your health care provider. Make sure you discuss any questions you have with your health care provider. ° °Emergency Department Resource Guide °1) Find a Doctor and Pay Out of Pocket °Although you won't have to find out who is covered by your insurance plan, it is a good idea to ask around and get  recommendations. You will then need to call the office and see if the doctor you have chosen will accept you as a new patient and what types of options they offer for patients who are self-pay. Some doctors offer discounts or will set up payment plans for their patients who do not have insurance, but you will need to ask so you aren't surprised when you get to your appointment. ° °2) Contact Your Local Health Department °Not all health departments have doctors that can see patients for sick visits, but many do, so it is worth a call to see if yours does. If you don't know where your local health department is, you can check in your phone book. The CDC also has a tool to help you locate your state's health department, and many state websites also have listings of all of their local health departments. ° °3) Find a Walk-in Clinic °If your illness is not likely to be very severe or complicated, you may want to try a walk in clinic. These are popping up all over the country in pharmacies, drugstores, and shopping centers. They're usually staffed by nurse practitioners or physician assistants that have been trained to treat common illnesses and complaints. They're usually fairly quick and inexpensive. However, if you have serious medical issues   or chronic medical problems, these are probably not your best option. ° °No Primary Care Doctor: °- Call [facility] at  832-8000 - they can help you locate a primary care doctor that  accepts your insurance, provides certain services, etc. °- Physician Referral Service- 1-800-533-3463 ° °Chronic Pain Problems: °Organization         Address  Phone   Notes  °[facility]  (336) 297-2271 Patients need to be referred by their primary care doctor.  ° °Medication Assistance: °Organization         Address  Phone   Notes  °[place] [facility] [address] °[place], [place] [address] (336) 641-8030 --Must be a resident of  [place] °-- Must have NO insurance coverage whatsoever (no Medicaid/ Medicare, etc.) °-- The pt. MUST have a primary care doctor that directs their care regularly and follows them in the community °  °[facility]  (866) 331-1348   °[employer]  (888) 892-1162   ° °Agencies that provide inexpensive medical care: °Organization         Address  Phone   Notes  °[facility]  (336) 832-8035   °[facility]    (336) 832-7272   °[facility] [address] °[place], [place] [address] (336) 832-4777   °[facility] [address], °[place] (336) 271-4999   °[facility]    (336) 373-0678   °[facility]    (336) 272-1050   °[facility] ° [address], [place] Phone:  (336) 832-4444, Fax:  (336) 832-4440 Hours of Operation:  9 am - 6 pm, M-F.  Also accepts Medicaid/Medicare and self-pay.  °[facility] ° [address], [place] Phone: (336) 832-3150, Fax: (336) 832-3151. Hours of Operation:  8:30 am - 5:30 pm, M-F.  Also accepts Medicaid and self-pay.  °[facility] [place] [address], [place] Phone: (336) 878-6027   °[facility] [address], [place], [place] (336)723-1848, Ext. 123 Mondays & Thursdays: 7-9 AM.  First 15 patients are seen on a first come, first serve basis. °  ° °Medicaid-accepting [place] Providers: ° °Organization         Address  Phone   Notes  °[facility] [address], [place] (336) 641-2100 Also accepts self-pay patients.  °[facility] [address], [place] ° (336) 856-9996   °[facility] [address], [place] (336) 288-8857   °[facility] [address] [place] [address], [place] (336) 299-7000   °Veita Bland [address], [place]  ° (336) 373-1557 Only accepts [place] Access  Medicaid patients after they have their name applied to their card.  ° °Self-Pay (no insurance) in [place]: ° °Organization         Address  Phone   Notes  °Sickle Cell Patients, [facility] [address], [place] (336) 832-1970   °[facility] [address], [place] (336) 832-4400   °[facility] ° [address], [place] (336) 992-4800   °[facility]/Dr. Osei-Bonsu ° [address] [place] [address], [place] or [address], [place] (336) 841-8500 Phone number for both [place] and [place] locations is the same.  °  [facility] [address], [place] (336) 299-0000   °[facility] [address] [place] [address], [place] or [address] (336) 852-7530 °(336) 878-2260   °[facility] [address], [place] (336) 350-1642, phone; (336) 294-5005, fax Sees patients 1st and 3rd Saturday of every month.  Must not qualify for public or private insurance (i.e. Medicaid, Medicare, [place] Health Choice, Veterans' Benefits) • Household income should be no more than 200% of the poverty level •The clinic cannot treat you if you are pregnant or think you are pregnant • Sexually transmitted diseases are not treated at the clinic.  ° ° °Dental Care: °Organization         Address  Phone  Notes  °[place] [facility] [address], [place] (336) 641-6152 Accepts children up to age 21 who are enrolled in Medicaid or [place] Health Choice; pregnant women with a Medicaid card; and children who have applied for Medicaid or [place] Health Choice, but were declined, whose parents can pay a reduced fee at time of service.  °[facility] [place]  [address], [place] (336) 641-7733 Accepts children up to age 21 who are enrolled in Medicaid or [place] Health Choice; pregnant women with a Medicaid  card; and children who have applied for Medicaid or [place] Health Choice, but were declined, whose parents can pay a reduced fee at time of service.  °[facility] ° [address], [place] (336) 641-4533 Patients are seen by appointment only. Walk-ins are not accepted. [facility] will see patients 18 years of age and older. °Monday - Tuesday (8am-5pm) °Most Wednesdays (8:30-5pm) °$30 per visit, cash only  °[facility] ° [address], [place] (336) 641-4533 Patients are seen by appointment only. Walk-ins are not accepted. [facility] will see patients 18 years of age and older. °One Wednesday Evening (Monthly: Volunteer Based).  $30 per visit, cash only  °[facility]  (919) 537-3737 for adults; Children under age 4, call Graduate Pediatric Dentistry at (919) [phone]. Children aged 4-14, please call (919) [phone] to request a pediatric application. ° Dental services are provided in all areas of dental care including fillings, crowns and bridges, complete and partial dentures, implants, gum treatment, root canals, and extractions. Preventive care is also provided. Treatment is provided to both adults and children. °Patients are selected via a lottery and there is often a waiting list. °  °[facility] [address], °[place] ° (336) 763-8833 [email or web address] °  °[facility] [address], [place], [place] (336)723-1848, Ext. 123 Second and Fourth Thursday of each month, opens at 6:30 AM; Clinic ends at 9 AM.  Patients are seen on a first-come first-served basis, and a limited number are seen during each clinic.  ° °[facility] ° [address], [place],  (336) 723-7904   Eligibility Requirements °You must have lived in [place], [place], or [place] counties for at least the last three months. °  You cannot be eligible for state or federal sponsored healthcare insurance,  including Veterans Administration, Medicaid, or Medicare. °  You generally cannot be eligible for healthcare insurance through your employer.  °  How to apply: °Eligibility screenings are held every Tuesday and Wednesday afternoon from 1:00 pm until 4:00 pm. You do not need an appointment for the interview!  °  [facility] [address], [place], [place] 336-631-2330   °[facility]  336-342-8273   °[facility]  336-703-3100   °[facility]  336-570-6415   ° °Behavioral Health Resources in the Community: °Intensive Outpatient Programs °Organization         Address  Phone  Notes  °[facility] [address], [place], [place] 336-878-6098   °[facility] [address], [place], [place] 336-832-9800   °[facility] [address], [place], [place] ° 336-882-2125   °[facility] [address],  °[place], [place] 1-800-853-5163 or 336-641-4981   °Substance Abuse Resources °Organization         Address  Phone  Notes  °[facility]  336-882-2125   °[facility]  336-784-9470   °[facility]  336-285-9073   °[facility]  336-845-3988   °Residential & Outpatient Substance Abuse Program  1-800-659-3381   °Psychological Services °Organization         Address  Phone  Notes  °[facility]  336- 832-9600   °[facility]  336- 378-7881   °[facility] [address], [place] 1-800-853-5163 or 336-641-4981   ° °Mobile Crisis Teams °Organization         Address  Phone  Notes  °[facility], Mobile Crisis Care Unit  1-877-626-1772   °Assertive °[facility] ° [address] [place], [place] 336-834-9664   °Sharon DeEsch [address] °[place] 336-554-5454   ° °Self-Help/Support Groups °Organization         Address  Phone             Notes  °[facility] [place] - variety of support groups  336- 373-1402 Call for more information  °Narcotics Anonymous (NA), [facility] [address], °[place]  2 meetings at this location  ° °Residential Treatment Programs °Organization         Address  Phone  Notes  °[facility] [address],    °[place]  1-866-801-8205   °[facility] ° [address], [place], [place] 704-293-8524   °[facility] [address], [place] 336-845-3988 Admissions: 8am-3pm M-F  °[facility] [address]    °[place], [place] 336-841-1104   °[facility] [address], [place], [place] 336-379-7146   °[facility] [address]  °[place], [place] 336-285-9073   °[facility] - Intensive Outpatient [address], , [place] 336-852-3033   °[facility] [address]  °[place], [place] 1-877-615-2722 or 336-784-9470   °[facility] [address] [place], [place] 336-227-7417 Accepts Medicaid  °[facility] [address]  ° [place] 1-800-659-3381 Substance Abuse/Addiction Treatment  ° °[place] Behavioral Health Resources °Organization         Address  Phone  Notes  °[facility]  (888) 581-9988   °Julie Brannon, PhD [address] [place], [place]   (336) 349-5553 or (336) 951-0000   °[facility]   [address] °[place], [place] (336) 349-4454   °[facility] [address], [place],  (336) 342-8316 Insurance/Medicaid/sponsorship through [facility]  °[facility] [address]                                      [place], [place] (336) 342-8316 Therapy/tele-psych/case  °[facility] [address]  ° [place], [place] (336) 349-2233    °Dr. Arfeen  (336) 349-4544   °[facility] [place]  [employer] [place] [facility] 1) [address], [place] °2)  [address], [place] °3)  [address], [place] (336) 349-3220 °(336) 342-7768 ° °(336) 342-8140   °[place] [facility] (336) 342-1394 or (336) 342-3537 (After Hours)    ° ° ° ° °

## 2015-05-21 NOTE — ED Provider Notes (Signed)
CSN: [id number]     Arrival date & time 05/21/15  1356 History  This chart was scribed for a non-physician practitioner, Catha Gosselin, PA-C working with Azalia Bilis, MD by [place], ED Scribe. The patient was seen in W[facility]. The patient's care was started at 2:16 PM.    Chief Complaint  Patient presents with  . Cough  . Nasal Congestion    x 2 days  . Facial Pain  . Shortness of Breath    intermittent     The history is provided by the patient. No language interpreter was used.  HPI Comments: Angela Conrad is a 24 y.o. female who presents to the Emergency Department complaining of severe cough and congestion over the past 3 days that got progressively worse yesterday. She also complains of facial pain, intermittent SOB, and chest pain stemming from coughing so much. No complaints of fever. She further denies any history of asthma. Pt notes she has tried taking sinus medication and allergy medication to address symptoms without relief. She adds that her boyfriend whom she lives with was just recently diagnosed with pneumonia. Pt is non-smoker.    Past Medical History  Diagnosis Date  . No pertinent past medical history   . Bronchitis    Past Surgical History  Procedure Laterality Date  . Adenoidectomy     Family History  Problem Relation Age of Onset  . Anesthesia problems Neg Hx   . Hypotension Neg Hx   . Malignant hyperthermia Neg Hx   . Pseudochol deficiency Neg Hx   . Diabetes Mother   . Diabetes Other   . Hypertension Other    History  Substance Use Topics  . Smoking status: Never Smoker   . Smokeless tobacco: Not on file  . Alcohol Use: 0.6 oz/week    1 Glasses of wine per week     Comment: 1 GLASS  OCC::       OB History    Gravida Para Term Preterm AB TAB SAB Ectopic Multiple Living   0     Review of Systems  Constitutional: Negative for fever and chills.  HENT: Positive for congestion. Negative for ear pain, sore throat and  trouble swallowing.   Eyes: Negative for pain, discharge and itching.  Respiratory: Positive for cough and shortness of breath.       Allergies  Other and Tomato  Home Medications   Prior to Admission medications   Medication Sig Start Date End Date Taking? Authorizing Provider  Chlorpheniramine-Phenylephrine (SINUS & ALLERGY PO) Take 1 tablet by mouth daily.   Yes Historical Provider, MD  diclofenac (VOLTAREN) 50 MG EC tablet Take 1 tablet (50 mg total) by mouth 2 (two) times daily as needed. Patient not taking: Reported on 05/21/2015 08/15/14   Rodolph Bong, MD  doxycycline (VIBRAMYCIN) 50 MG capsule Take 2 capsules (100 mg total) by mouth 2 (two) times daily. Patient not taking: Reported on 05/21/2015 01/12/15   Armando Reichert, CNM  HYDROcodone-homatropine [facility]) 5-1.5 MG/5ML syrup Take 5 mLs by mouth every 6 (six) hours as needed for cough. 05/21/15   Adraine Biffle Patel-Mills, PA-C   BP 116/68 mmHg  Pulse 84  Temp(Src) 98.7 F (37.1 C) (Oral)  Resp 18  SpO2 100% Physical Exam  Constitutional: She is oriented to person, place, and time. She appears well-developed and well-nourished. No distress.  HENT:  Head: Normocephalic and atraumatic.  Right Ear: Tympanic membrane normal.  Left  Ear: Tympanic membrane normal.  Mouth/Throat: Uvula is midline, oropharynx is clear and moist and mucous membranes are normal. No oropharyngeal exudate or tonsillar abscesses.  Eyes: Conjunctivae and EOM are normal.  Neck: Neck supple. No tracheal deviation present.  Cardiovascular: Normal rate, regular rhythm and normal heart sounds.   Pulmonary/Chest: Effort normal. No respiratory distress. She has no wheezes. She exhibits no tenderness.  Musculoskeletal: Normal range of motion.  Neurological: She is alert and oriented to person, place, and time.  Skin: Skin is warm and dry.  Psychiatric: She has a normal mood and affect. Her behavior is normal.  Nursing note and vitals reviewed.   ED Course   Procedures (including critical care time) Labs Review Labs Reviewed - No data to display  Imaging Review Dg Chest 2 View  05/21/2015   CLINICAL DATA:  Cough and chest pain for 1 day  EXAM: CHEST  2 VIEW  COMPARISON:  Chest radiograph April 11, 2011; chest CT June 27, 2014  FINDINGS: Lungs are clear. Heart size and pulmonary vascularity are normal. No adenopathy. No pneumothorax. No bone lesions.  IMPRESSION: No abnormality noted.   Electronically Signed   By: Bretta BangWilliam  Woodruff III M.D.   On: 05/21/2015 14:59     EKG Interpretation None     Medications  albuterol (PROVENTIL HFA;VENTOLIN HFA) 108 (90 BASE) MCG/ACT inhaler 2 puff (not administered)    2:20 PM- Treatment plan was discussed with patient who verbalizes understanding and agrees.   MDM   Final diagnoses:  Upper respiratory infection, viral  Patient presents for cough and nasal congestion for the past 3 days. Her chest is hurting due to coughing. He states her boyfriend has pneumonia. I believe this is a viral upper respiratory infection. She is currently afebrile and her vital signs are normal. Her x-ray is negative for pneumonia or pneumothorax. I have given her an albuterol inhaler in the ED. I have also given her Hycodan for cough. I explained that she should not drive while taking this medication and to take at night before she goes to bed. [facility]. She can follow-up with her PCP using the resource guide and agrees with the plan.  I personally performed the services described in this documentation, which was scribed in my presence. The recorded information has been reviewed and is accurate.    Catha GosselinHanna Patel-Mills, PA-C 05/21/15 1515  Azalia BilisKevin Campos, MD 05/21/15 1544

## 2015-05-21 NOTE — ED Notes (Signed)
Pt stated that she has increased chest wall pain with cough over last few days. C/o sinus congestion and shortness of breath. VS stable. No audible wheezing

## 2015-07-16 ENCOUNTER — Encounter (HOSPITAL_COMMUNITY): Payer: Self-pay | Admitting: Emergency Medicine

## 2015-07-16 ENCOUNTER — Emergency Department (INDEPENDENT_AMBULATORY_CARE_PROVIDER_SITE_OTHER)
Admission: EM | Admit: 2015-07-16 | Discharge: 2015-07-16 | Disposition: A | Discharge status: Home / Self Care | Payer: Self-pay | Source: Home / Self Care | Attending: Emergency Medicine | Admitting: Emergency Medicine

## 2015-07-16 ENCOUNTER — Emergency Department (INDEPENDENT_AMBULATORY_CARE_PROVIDER_SITE_OTHER): Payer: Self-pay

## 2015-07-16 DIAGNOSIS — W19XXXA Unspecified fall, initial encounter: Secondary | ICD-10-CM

## 2015-07-16 DIAGNOSIS — S63501A Unspecified sprain of right wrist, initial encounter: Secondary | ICD-10-CM

## 2015-07-16 MED ORDER — IBUPROFEN 800 MG PO TABS
ORAL_TABLET | ORAL | Status: AC
  Filled 2015-07-16: qty 1

## 2015-07-16 MED ORDER — IBUPROFEN 800 MG PO TABS
800.0000 mg | ORAL_TABLET | Freq: Once | ORAL | Status: AC
  Administered 2015-07-16: 800 mg via ORAL

## 2015-07-16 NOTE — Discharge Instructions (Signed)
Wrist Sprain with Rehab Ice for 2-3 days Wear the wrist splint for 3-5 days as needed. Motrin for pain A sprain is an injury in which a ligament that maintains the proper alignment of a joint is partially or completely torn. The ligaments of the wrist are susceptible to sprains. Sprains are classified into three categories. Grade 1 sprains cause pain, but the tendon is not lengthened. Grade 2 sprains include a lengthened ligament because the ligament is stretched or partially ruptured. With grade 2 sprains there is still function, although the function may be diminished. Grade 3 sprains are characterized by a complete tear of the tendon or muscle, and function is usually impaired. SYMPTOMS   Pain tenderness, inflammation, and/or bruising (contusion) of the injury.  A "pop" or tear felt and/or heard at the time of injury.  Decreased wrist function. CAUSES  A wrist sprain occurs when a force is placed on one or more ligaments that is greater than it/they can withstand. Common mechanisms of injury include:  Catching a ball with you hands.  Repetitive and/ or strenuous extension or flexion of the wrist. RISK INCREASES WITH:  Previous wrist injury.  Contact sports (boxing or wrestling).  Activities in which falling is common.  Poor strength and flexibility.  Improperly fitted or padded protective equipment. PREVENTION  Warm up and stretch properly before activity.  Allow for adequate recovery between workouts.  Maintain physical fitness:  Strength, flexibility, and endurance.  Cardiovascular fitness.  Protect the wrist joint by limiting its motion with the use of taping, braces, or splints.  Protect the wrist after injury for 6 to 12 months. PROGNOSIS  The prognosis for wrist sprains depends on the degree of injury. Grade 1 sprains require 2 to 6 weeks of treatment. Grade 2 sprains require 6 to 8 weeks of treatment, and grade 3 sprains require up to 12 weeks.  RELATED  COMPLICATIONS   Prolonged healing time, if improperly treated or re-injured.  Recurrent symptoms that result in a chronic problem.  Injury to nearby structures (bone, cartilage, nerves, or tendons).  Arthritis of the wrist.  Inability to compete in athletics at a high level.  Wrist stiffness or weakness.  Progression to a complete rupture of the ligament. TREATMENT  Treatment initially involves resting from any activities that aggravate the symptoms, and the use of ice and medications to help reduce pain and inflammation. Your caregiver may recommend immobilizing the wrist for a period of time in order to reduce stress on the ligament and allow for healing. After immobilization it is important to perform strengthening and stretching exercises to help regain strength and a full range of motion. These exercises may be completed at home or with a therapist. Surgery is not usually required for wrist sprains, unless the ligament has been ruptured (grade 3 sprain). MEDICATION   If pain medication is necessary, then nonsteroidal anti-inflammatory medications, such as aspirin and ibuprofen, or other minor pain relievers, such as acetaminophen, are often recommended.  Do not take pain medication for 7 days before surgery.  Prescription pain relievers may be given if deemed necessary by your caregiver. Use only as directed and only as much as you need. HEAT AND COLD  Cold treatment (icing) relieves pain and reduces inflammation. Cold treatment should be applied for 10 to 15 minutes every 2 to 3 hours for inflammation and pain and immediately after any activity that aggravates your symptoms. Use ice packs or massage the area with a piece of ice (ice massage).  Heat treatment may be used prior to performing the stretching and strengthening activities prescribed by your caregiver, physical therapist, or athletic trainer. Use a heat pack or soak your injury in warm water. SEEK MEDICAL CARE  IF:  Treatment seems to offer no benefit, or the condition worsens.  Any medications produce adverse side effects. EXERCISES RANGE OF MOTION (ROM) AND STRETCHING EXERCISES - Wrist Sprain  These exercises may help you when beginning to rehabilitate your injury. Your symptoms may resolve with or without further involvement from your physician, physical therapist or athletic trainer. While completing these exercises, remember:   Restoring tissue flexibility helps normal motion to return to the joints. This allows healthier, less painful movement and activity.  An effective stretch should be held for at least 30 seconds.  A stretch should never be painful. You should only feel a gentle lengthening or release in the stretched tissue. RANGE OF MOTION - Wrist Flexion, Active-Assisted  Extend your right / left elbow with your fingers pointing down.*  Gently pull the back of your hand towards you until you feel a gentle stretch on the top of your forearm.  Hold this position for __________ seconds. Repeat __________ times. Complete this exercise __________ times per day.  *If directed by your physician, physical therapist or athletic trainer, complete this stretch with your elbow bent rather than extended. RANGE OF MOTION - Wrist Extension, Active-Assisted  Extend your right / left elbow and turn your palm upwards.*  Gently pull your palm/fingertips back so your wrist extends and your fingers point more toward the ground.  You should feel a gentle stretch on the inside of your forearm.  Hold this position for __________ seconds. Repeat __________ times. Complete this exercise __________ times per day. *If directed by your physician, physical therapist or athletic trainer, complete this stretch with your elbow bent, rather than extended. RANGE OF MOTION - Supination, Active  Stand or sit with your elbows at your side. Bend your right / left elbow to 90 degrees.  Turn your palm upward  until you feel a gentle stretch on the inside of your forearm.  Hold this position for __________ seconds. Slowly release and return to the starting position. Repeat __________ times. Complete this stretch __________ times per day.  RANGE OF MOTION - Pronation, Active  Stand or sit with your elbows at your side. Bend your right / left elbow to 90 degrees.  Turn your palm downward until you feel a gentle stretch on the top of your forearm.  Hold this position for __________ seconds. Slowly release and return to the starting position. Repeat __________ times. Complete this stretch __________ times per day.  STRETCH - Wrist Flexion  Place the back of your right / left hand on a tabletop leaving your elbow slightly bent. Your fingers should point away from your body.  Gently press the back of your hand down onto the table by straightening your elbow. You should feel a stretch on the top of your forearm.  Hold this position for __________ seconds. Repeat __________ times. Complete this stretch __________ times per day.  STRETCH - Wrist Extension  Place your right / left fingertips on a tabletop leaving your elbow slightly bent. Your fingers should point backwards.  Gently press your fingers and palm down onto the table by straightening your elbow. You should feel a stretch on the inside of your forearm.  Hold this position for __________ seconds. Repeat __________ times. Complete this stretch __________ times per day.  STRENGTHENING EXERCISES - Wrist Sprain These exercises may help you when beginning to rehabilitate your injury. They may resolve your symptoms with or without further involvement from your physician, physical therapist or athletic trainer. While completing these exercises, remember:   Muscles can gain both the endurance and the strength needed for everyday activities through controlled exercises.  Complete these exercises as instructed by your physician, physical therapist  or athletic trainer. Progress with the resistance and repetition exercises only as your caregiver advises. STRENGTH - Wrist Flexors  Sit with your right / left forearm palm-up and fully supported. Your elbow should be resting below the height of your shoulder. Allow your wrist to extend over the edge of the surface.  Loosely holding a __________ weight or a piece of rubber exercise band/tubing, slowly curl your hand up toward your forearm.  Hold this position for __________ seconds. Slowly lower the wrist back to the starting position in a controlled manner. Repeat __________ times. Complete this exercise __________ times per day.  STRENGTH - Wrist Extensors  Sit with your right / left forearm palm-down and fully supported. Your elbow should be resting below the height of your shoulder. Allow your wrist to extend over the edge of the surface.  Loosely holding a __________ weight or a piece of rubber exercise band/tubing, slowly curl your hand up toward your forearm.  Hold this position for __________ seconds. Slowly lower the wrist back to the starting position in a controlled manner. Repeat __________ times. Complete this exercise __________ times per day.  STRENGTH - Ulnar Deviators  Stand with a ____________________ weight in your right / left hand, or sit holding on to the rubber exercise band/tubing with your opposite arm supported.  Move your wrist so that your pinkie travels toward your forearm and your thumb moves away from your forearm.  Hold this position for __________ seconds and then slowly lower the wrist back to the starting position. Repeat __________ times. Complete this exercise __________ times per day STRENGTH - Radial Deviators  Stand with a ____________________ weight in your  right / left hand, or sit holding on to the rubber exercise band/tubing with your arm supported.  Raise your hand upward in front of you or pull up on the rubber tubing.  Hold this  position for __________ seconds and then slowly lower the wrist back to the starting position. Repeat __________ times. Complete this exercise __________ times per day. STRENGTH - Forearm Supinators  Sit with your right / left forearm supported on a table, keeping your elbow below shoulder height. Rest your hand over the edge, palm down.  Gently grip a hammer or a soup ladle.  Without moving your elbow, slowly turn your palm and hand upward to a "thumbs-up" position.  Hold this position for __________ seconds. Slowly return to the starting position. Repeat __________ times. Complete this exercise __________ times per day.  STRENGTH - Forearm Pronators  Sit with your right / left forearm supported on a table, keeping your elbow below shoulder height. Rest your hand over the edge, palm up.  Gently grip a hammer or a soup ladle.  Without moving your elbow, slowly turn your palm and hand upward to a "thumbs-up" position.  Hold this position for __________ seconds. Slowly return to the starting position. Repeat __________ times. Complete this exercise __________ times per day.  STRENGTH - Grip  Grasp a tennis ball, a dense sponge, or a large, rolled sock in your hand.  Squeeze as hard as you  can without increasing any pain.  Hold this position for __________ seconds. Release your grip slowly. Repeat __________ times. Complete this exercise __________ times per day.  Document Released: 12/12/2005 Document Revised: 03/05/2012 Document Reviewed: 03/26/2009 [facility] Patient Information 2015 [place], [place]. This information is not intended to replace advice given to you by your health care provider. Make sure you discuss any questions you have with your health care provider.  Sprain A sprain happens when the bands of tissue that connect bones and hold joints together (ligaments) stretch too much or tear. HOME CARE  Raise (elevate) the injured area to lessen puffiness (swelling).  Put  ice on the injured area 2 times a day for 2-3 days.  Put ice in a plastic bag.  Place a towel between your skin and the bag.  Leave the ice on for 15 minutes.  Only take medicine as told by your doctor.  Protect your injured area until your pain and stiffness go away.  Do not get your cast or splint wet. Cover your cast or splint with a plastic bag when you shower or take a bath. Do not swim in a pool.  Your doctor may suggest exercises during your recovery to keep from getting stiff. GET HELP RIGHT AWAY IF:   Your cast or splint becomes damaged.  Your pain gets worse. MAKE SURE YOU:   Understand these instructions.  Will watch this condition.  Will get help right away if you are not doing well or get worse. Document Released: 05/30/2008 Document Revised: 10/02/2013 Document Reviewed: 12/24/2011 [facility] Patient Information 2015 [place], [place]. This information is not intended to replace advice given to you by your health care provider. Make sure you discuss any questions you have with your health care provider.

## 2015-07-16 NOTE — ED Provider Notes (Signed)
CSN: [id number]     Arrival date & time 07/16/15  1934 History   First MD Initiated Contact with Patient 07/16/15 2017     Chief Complaint  Patient presents with  . Fall  . Wrist Pain   (Consider location/radiation/quality/duration/timing/severity/associated sxs/prior Treatment) HPI Comments: 24 year old female states she fell at [facility] approximately 45 minutes prior to being seen in the urgent care. She fell on her outstretched right arm. She is complaining of pain in her wrist and proximal hand.   Past Medical History  Diagnosis Date  . No pertinent past medical history   . Bronchitis    Past Surgical History  Procedure Laterality Date  . Adenoidectomy     Family History  Problem Relation Age of Onset  . Anesthesia problems Neg Hx   . Hypotension Neg Hx   . Malignant hyperthermia Neg Hx   . Pseudochol deficiency Neg Hx   . Diabetes Mother   . Diabetes Other   . Hypertension Other    History  Substance Use Topics  . Smoking status: Never Smoker   . Smokeless tobacco: Not on file  . Alcohol Use: 0.6 oz/week    1 Glasses of wine per week     Comment: 1 GLASS  OCC::       OB History    Gravida Para Term Preterm AB TAB SAB Ectopic Multiple Living   0     Review of Systems  Constitutional: Negative.  Negative for fever, chills and activity change.  HENT: Negative.   Respiratory: Negative.   Musculoskeletal:       As per HPI  Skin: Negative for color change, pallor and rash.  Neurological: Negative.     Allergies  Other and Tomato  Home Medications   Prior to Admission medications   Medication Sig Start Date End Date Taking? Authorizing Provider  Chlorpheniramine-Phenylephrine (SINUS & ALLERGY PO) Take 1 tablet by mouth daily.    Historical Provider, MD  HYDROcodone-homatropine (HYCODAN) 5-1.5 MG/5ML syrup Take 5 mLs by mouth every 6 (six) hours as needed for cough. 05/21/15   Hanna Patel-Mills, PA-C   BP 125/75 mmHg  Pulse 101  Temp(Src) 99.3  F (37.4 C) (Oral)  Resp 20  SpO2 96%  LMP 05/07/2015 Physical Exam  Constitutional: She is oriented to person, place, and time. She appears well-developed and well-nourished. No distress.  HENT:  Head: Normocephalic and atraumatic.  Eyes: EOM are normal.  Neck: Normal range of motion. Neck supple.  Pulmonary/Chest: Effort normal. No respiratory distress.  Musculoskeletal:  Right hand and wrist without edema or deformity. Flexion and extension of the wrist is limited due to pain. She is able to make a fist although the third fourth and fifth digits calls pain in the wrist when she tries to close her fist completely. Pronation and supination is intact. Diffuse minor tenderness to the wrist. Again no swelling or deformity appreciated. No hand tenderness. Distal neurovascular motor Sentry is intact. Radial pulse 2+.  Neurological: She is alert and oriented to person, place, and time. No cranial nerve deficit. She exhibits normal muscle tone.  Skin: Skin is warm and dry.  Psychiatric: She has a normal mood and affect.  Nursing note and vitals reviewed.   ED Course  Procedures (including critical care time) Labs Review Labs Reviewed - No data to display  Imaging Review Dg Wrist Complete Right  07/16/2015   CLINICAL DATA:  24 year old female with fall and  wrist swelling  EXAM: RIGHT WRIST - COMPLETE 3+ VIEW  COMPARISON:  None.  FINDINGS: There is no evidence of fracture or dislocation. There is no evidence of arthropathy or other focal bone abnormality. Soft tissues are unremarkable.  IMPRESSION: No fracture or dislocation.   Electronically Signed   By: Elgie Collard M.D.   On: 07/16/2015 20:13     MDM   1. Right wrist sprain, initial encounter   2. Fall from standing, initial encounter    Ice for 2-3 days Wear the wrist splint for 3-5 days as needed. Motrin for pain Motrin 800 mg po now    Hayden Rasmussen, NP 07/16/15 2028  Hayden Rasmussen, NP 07/16/15 2036

## 2015-07-16 NOTE — ED Notes (Signed)
Pt comes in with right wrist burning pain, swelling after slipping on soda at [employer] 30 minutes ago. Swelling and small abrasion noted to wrist Ice pack intact

## 2015-07-19 ENCOUNTER — Emergency Department (HOSPITAL_COMMUNITY): Payer: Self-pay

## 2015-07-19 ENCOUNTER — Encounter (HOSPITAL_COMMUNITY): Payer: Self-pay | Admitting: Emergency Medicine

## 2015-07-19 ENCOUNTER — Emergency Department (HOSPITAL_COMMUNITY)
Admission: EM | Admit: 2015-07-19 | Discharge: 2015-07-19 | Disposition: A | Discharge status: Home / Self Care | Payer: Self-pay | Attending: Emergency Medicine | Admitting: Emergency Medicine

## 2015-07-19 DIAGNOSIS — Z79899 Other long term (current) drug therapy: Secondary | ICD-10-CM | POA: Insufficient documentation

## 2015-07-19 DIAGNOSIS — S301XXA Contusion of abdominal wall, initial encounter: Secondary | ICD-10-CM | POA: Insufficient documentation

## 2015-07-19 DIAGNOSIS — S79921A Unspecified injury of right thigh, initial encounter: Secondary | ICD-10-CM | POA: Insufficient documentation

## 2015-07-19 DIAGNOSIS — Y998 Other external cause status: Secondary | ICD-10-CM | POA: Insufficient documentation

## 2015-07-19 DIAGNOSIS — Z8709 Personal history of other diseases of the respiratory system: Secondary | ICD-10-CM | POA: Insufficient documentation

## 2015-07-19 DIAGNOSIS — S30811A Abrasion of abdominal wall, initial encounter: Secondary | ICD-10-CM | POA: Insufficient documentation

## 2015-07-19 DIAGNOSIS — S1093XA Contusion of unspecified part of neck, initial encounter: Secondary | ICD-10-CM | POA: Insufficient documentation

## 2015-07-19 DIAGNOSIS — Y92410 Unspecified street and highway as the place of occurrence of the external cause: Secondary | ICD-10-CM | POA: Insufficient documentation

## 2015-07-19 DIAGNOSIS — S86911A Strain of unspecified muscle(s) and tendon(s) at lower leg level, right leg, initial encounter: Secondary | ICD-10-CM | POA: Insufficient documentation

## 2015-07-19 DIAGNOSIS — Y9389 Activity, other specified: Secondary | ICD-10-CM | POA: Insufficient documentation

## 2015-07-19 DIAGNOSIS — Z3202 Encounter for pregnancy test, result negative: Secondary | ICD-10-CM | POA: Insufficient documentation

## 2015-07-19 LAB — POC URINE PREG, ED: PREG TEST UR: NEGATIVE

## 2015-07-19 MED ORDER — IBUPROFEN 800 MG PO TABS: 800.0000 mg | ORAL_TABLET | Freq: Three times a day (TID) | ORAL | Status: DC

## 2015-07-19 MED ORDER — CYCLOBENZAPRINE HCL 10 MG PO TABS: 10.0000 mg | ORAL_TABLET | Freq: Two times a day (BID) | ORAL | Status: DC | PRN

## 2015-07-19 MED ORDER — OXYCODONE-ACETAMINOPHEN 5-325 MG PO TABS: 1.0000 | ORAL_TABLET | ORAL | Status: DC | PRN

## 2015-07-19 MED ORDER — MORPHINE SULFATE 4 MG/ML IJ SOLN
4.0000 mg | Freq: Once | INTRAMUSCULAR | Status: AC
  Administered 2015-07-19: 4 mg via INTRAVENOUS
  Filled 2015-07-19: qty 1

## 2015-07-19 MED ORDER — ONDANSETRON HCL 4 MG/2ML IJ SOLN
4.0000 mg | Freq: Once | INTRAMUSCULAR | Status: AC
  Administered 2015-07-19: 4 mg via INTRAVENOUS
  Filled 2015-07-19: qty 2

## 2015-07-19 MED ORDER — IOHEXOL 300 MG/ML  SOLN
100.0000 mL | Freq: Once | INTRAMUSCULAR | Status: AC | PRN
  Administered 2015-07-19: 100 mL via INTRAVENOUS
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
## 2015-07-19 NOTE — ED Notes (Signed)
Pt was able to ambulate with crutches and knee immobilizer with no assistance.
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
## 2015-07-19 NOTE — ED Notes (Signed)
Patient does not have any jewelry on.
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
## 2016-06-09 ENCOUNTER — Inpatient Hospital Stay (HOSPITAL_COMMUNITY)
Admission: AD | Admit: 2016-06-09 | Discharge: 2016-06-09 | Disposition: A | Discharge status: Home / Self Care | Payer: Self-pay | Source: Ambulatory Visit | Attending: Obstetrics & Gynecology | Admitting: Obstetrics & Gynecology

## 2016-06-09 ENCOUNTER — Encounter (HOSPITAL_COMMUNITY): Payer: Self-pay | Admitting: *Deleted

## 2016-06-09 DIAGNOSIS — N946 Dysmenorrhea, unspecified: Secondary | ICD-10-CM | POA: Insufficient documentation

## 2016-06-09 DIAGNOSIS — Z3202 Encounter for pregnancy test, result negative: Secondary | ICD-10-CM | POA: Insufficient documentation

## 2016-06-09 LAB — URINALYSIS, ROUTINE W REFLEX MICROSCOPIC
Bilirubin Urine: NEGATIVE
Glucose, UA: NEGATIVE mg/dL
Ketones, ur: NEGATIVE mg/dL
LEUKOCYTES UA: NEGATIVE
NITRITE: NEGATIVE
PH: 6 (ref 5.0–8.0)
Protein, ur: NEGATIVE mg/dL
Specific Gravity, Urine: 1.025 (ref 1.005–1.030)

## 2016-06-09 LAB — URINE MICROSCOPIC-ADD ON

## 2016-06-09 LAB — WET PREP, GENITAL
CLUE CELLS WET PREP: NONE SEEN
Sperm: NONE SEEN
TRICH WET PREP: NONE SEEN
Yeast Wet Prep HPF POC: NONE SEEN

## 2016-06-09 LAB — POCT PREGNANCY, URINE: Preg Test, Ur: NEGATIVE

## 2016-06-09 LAB — HCG, QUANTITATIVE, PREGNANCY

## 2016-06-09 MED ORDER — IBUPROFEN 800 MG PO TABS: 800.0000 mg | ORAL_TABLET | Freq: Three times a day (TID) | ORAL | Status: DC | PRN

## 2016-06-09 NOTE — Discharge Instructions (Signed)

## 2016-06-09 NOTE — MAU Provider Note (Signed)
History     CSN: [id number]  Arrival date and time: 06/09/16 1950   First Provider Initiated Contact with Patient 06/09/16 2039      Chief Complaint  Patient presents with  . Pelvic Pain   HPI Comments: Angela Conrad is a 25 y.o. G2P0020 who presents today with cramping and bleeding. She states that she had 4 +HPT last week.   Pelvic Pain The patient's primary symptoms include pelvic pain and vaginal bleeding. This is a new problem. The current episode started today. The problem occurs constantly. The problem has been gradually worsening. The problem affects both sides. She is not pregnant. Associated symptoms include abdominal pain. Pertinent negatives include no chills, constipation, diarrhea, dysuria, fever, frequency, nausea, urgency or vomiting. The vaginal discharge was bloody. The vaginal bleeding is heavier than menses. She has been passing clots. She has not been passing tissue. Nothing aggravates the symptoms. She has tried nothing for the symptoms. She uses nothing for contraception. Her menstrual history has been irregular (LMP 04/06/16 ).    Past Medical History  Diagnosis Date  . No pertinent past medical history   . Bronchitis     Past Surgical History  Procedure Laterality Date  . Adenoidectomy      Family History  Problem Relation Age of Onset  . Anesthesia problems Neg Hx   . Hypotension Neg Hx   . Malignant hyperthermia Neg Hx   . Pseudochol deficiency Neg Hx   . Diabetes Mother   . Diabetes Other   . Hypertension Other     Social History  Substance Use Topics  . Smoking status: Never Smoker   . Smokeless tobacco: Never Used  . Alcohol Use: 0.6 oz/week    1 Glasses of wine per week     Comment: 1 GLASS  OCC::        Allergies:  Allergies  Allergen Reactions  . Mushroom Extract Complex Anaphylaxis  . Tomato Hives and Nausea And Vomiting    Unknown reaction    Prescriptions prior to admission  Medication Sig Dispense Refill Last Dose  .  cetirizine (ZYRTEC) 10 MG tablet Take 10 mg by mouth daily as needed for allergies.    Past Month at Unknown time  . Prenatal Vit-Fe Fumarate-FA (PRENATAL MULTIVITAMIN) TABS tablet Take 1 tablet by mouth daily at 12 noon.   06/08/2016 at Unknown time    Review of Systems  Constitutional: Negative for fever and chills.  Gastrointestinal: Positive for abdominal pain. Negative for nausea, vomiting, diarrhea and constipation.  Genitourinary: Positive for pelvic pain. Negative for dysuria, urgency and frequency.   Physical Exam   Blood pressure 122/73, pulse 90, temperature 99 F (37.2 C), temperature source Oral, resp. rate 18, height  (1.549 m), weight 102.286 kg (225 lb 8 oz), last menstrual period 04/06/2016.  Physical Exam  Nursing note and vitals reviewed. Constitutional: She is oriented to person, place, and time. She appears well-developed and well-nourished. No distress.  HENT:  Head: Normocephalic.  Cardiovascular: Normal rate.   Respiratory: Effort normal.  GI: Soft. There is no tenderness. There is no rebound.  Genitourinary:   External: no lesion Vagina: small amount of white discharge Cervix: pink, smooth, no CMT Uterus: NSSC Adnexa: NT   Neurological: She is alert and oriented to person, place, and time.  Skin: Skin is warm and dry.  Psychiatric: She has a normal mood and affect.   Results for orders placed or performed during the hospital encounter of 06/09/16 (from the  past 24 hour(s))  Urinalysis, Routine w reflex microscopic ([facility])     Status: Abnormal   Collection Time: 06/09/16  8:12 PM  Result Value Ref Range   Color, Urine YELLOW YELLOW   APPearance CLEAR CLEAR   Specific Gravity, Urine 1.025 1.005 - 1.030   pH 6.0 5.0 - 8.0   Glucose, UA NEGATIVE NEGATIVE mg/dL   Hgb urine dipstick SMALL (A) NEGATIVE   Bilirubin Urine NEGATIVE NEGATIVE   Ketones, ur NEGATIVE NEGATIVE mg/dL   Protein, ur NEGATIVE NEGATIVE mg/dL   Nitrite NEGATIVE NEGATIVE    Leukocytes, UA NEGATIVE NEGATIVE  Urine microscopic-add on     Status: Abnormal   Collection Time: 06/09/16  8:12 PM  Result Value Ref Range   Squamous Epithelial / LPF 0-5 (A) NONE SEEN   WBC, UA 0-5 0 - 5 WBC/hpf   RBC / HPF 0-5 0 - 5 RBC/hpf   Bacteria, UA RARE (A) NONE SEEN   Urine-Other MUCOUS PRESENT   Pregnancy, urine POC     Status: None   Collection Time: 06/09/16  8:32 PM  Result Value Ref Range   Preg Test, Ur NEGATIVE NEGATIVE  Wet prep, genital     Status: Abnormal   Collection Time: 06/09/16  8:45 PM  Result Value Ref Range   Yeast Wet Prep HPF POC NONE SEEN NONE SEEN   Trich, Wet Prep NONE SEEN NONE SEEN   Clue Cells Wet Prep HPF POC NONE SEEN NONE SEEN   WBC, Wet Prep HPF POC FEW (A) NONE SEEN   Sperm NONE SEEN   hCG, quantitative, pregnancy     Status: None   Collection Time: 06/09/16  8:50 PM  Result Value Ref Range   hCG, Beta Chain, Quant, S <1 <5 mIU/mL    MAU Course  Procedures  MDM   Assessment and Plan   1. Menses painful   2. Dysmenorrhea    DC home Comfort measures reviewed  Bleeding precautions RX: ibuprofen 800mg  PRN #[id number]  Return to MAU as needed  Follow-up Information    [facility]   Why:  As needed   Contact information:   [address] [place] [address] [phone]        Tawnya CrookHogan, Heather Donovan 06/09/2016, 8:41 PM

## 2016-06-09 NOTE — MAU Note (Signed)
PT SAYS  4 HPT-  POSITIVE.  LMP  04-06-2016.     ON WAY HOME-   HAD VAG BLEEDING.   AND  NICKEL  SIZE   CLOTS.   .    NOW -  HAS TAMPON  IN -     THINKS  SPOTTING.        HAS CRAMPING -   STARTED  THIS  AM-    WORSE  NOW.

## 2016-06-10 LAB — HIV ANTIBODY (ROUTINE TESTING W REFLEX): HIV Screen 4th Generation wRfx: NONREACTIVE

## 2016-06-10 LAB — GC/CHLAMYDIA PROBE AMP (~~LOC~~) NOT AT ARMC
CHLAMYDIA, DNA PROBE: NEGATIVE
NEISSERIA GONORRHEA: NEGATIVE

## 2016-06-10 LAB — RPR: RPR: NONREACTIVE

## 2017-04-02 ENCOUNTER — Emergency Department (HOSPITAL_COMMUNITY)
Admission: EM | Admit: 2017-04-02 | Discharge: 2017-04-02 | Disposition: A | Discharge status: Home / Self Care | Payer: Self-pay | Attending: Emergency Medicine | Admitting: Emergency Medicine

## 2017-04-02 ENCOUNTER — Encounter (HOSPITAL_COMMUNITY): Payer: Self-pay | Admitting: Oncology

## 2017-04-02 DIAGNOSIS — R1116 Cannabis hyperemesis syndrome: Secondary | ICD-10-CM

## 2017-04-02 DIAGNOSIS — Z79899 Other long term (current) drug therapy: Secondary | ICD-10-CM | POA: Insufficient documentation

## 2017-04-02 DIAGNOSIS — R112 Nausea with vomiting, unspecified: Secondary | ICD-10-CM

## 2017-04-02 DIAGNOSIS — F12188 Cannabis abuse with other cannabis-induced disorder: Secondary | ICD-10-CM | POA: Insufficient documentation

## 2017-04-02 DIAGNOSIS — F12988 Cannabis use, unspecified with other cannabis-induced disorder: Secondary | ICD-10-CM

## 2017-04-02 LAB — RAPID URINE DRUG SCREEN, HOSP PERFORMED
AMPHETAMINES: NOT DETECTED
Barbiturates: NOT DETECTED
Benzodiazepines: NOT DETECTED
COCAINE: NOT DETECTED
Opiates: NOT DETECTED
Tetrahydrocannabinol: POSITIVE — AB

## 2017-04-02 LAB — COMPREHENSIVE METABOLIC PANEL
ALT: 22 U/L (ref 14–54)
AST: 21 U/L (ref 15–41)
Albumin: 4.4 g/dL (ref 3.5–5.0)
Alkaline Phosphatase: 95 U/L (ref 38–126)
Anion gap: 7 (ref 5–15)
BUN: 13 mg/dL (ref 6–20)
CO2: 26 mmol/L (ref 22–32)
Calcium: 9.6 mg/dL (ref 8.9–10.3)
Chloride: 106 mmol/L (ref 101–111)
Creatinine, Ser: 0.81 mg/dL (ref 0.44–1.00)
GFR calc Af Amer: >60 mL/min (ref >60)
Glucose, Bld: 103 mg/dL — ABNORMAL HIGH (ref 65–99)
POTASSIUM: 3.4 mmol/L — AB (ref 3.5–5.1)
Sodium: 139 mmol/L (ref 135–145)
TOTAL PROTEIN: 8.1 g/dL (ref 6.5–8.1)
Total Bilirubin: 0.3 mg/dL (ref 0.3–1.2)

## 2017-04-02 LAB — POC URINE PREG, ED: Preg Test, Ur: NEGATIVE

## 2017-04-02 LAB — I-STAT BETA HCG BLOOD, ED (MC, WL, AP ONLY)

## 2017-04-02 LAB — CBC
HEMATOCRIT: 41.4 % (ref 36.0–46.0)
Hemoglobin: 13.8 g/dL (ref 12.0–15.0)
MCH: 29.5 pg (ref 26.0–34.0)
MCHC: 33.3 g/dL (ref 30.0–36.0)
MCV: 88.5 fL (ref 78.0–100.0)
PLATELETS: 378 10*3/uL (ref 150–400)
RBC: 4.68 MIL/uL (ref 3.87–5.11)
RDW: 12 % (ref 11.5–15.5)
WBC: 9.9 10*3/uL (ref 4.0–10.5)

## 2017-04-02 LAB — URINALYSIS, ROUTINE W REFLEX MICROSCOPIC
BILIRUBIN URINE: NEGATIVE
Bacteria, UA: NONE SEEN
Glucose, UA: NEGATIVE mg/dL
Ketones, ur: NEGATIVE mg/dL
Nitrite: NEGATIVE
Protein, ur: NEGATIVE mg/dL
Specific Gravity, Urine: 1.029 (ref 1.005–1.030)
pH: 6 (ref 5.0–8.0)

## 2017-04-02 LAB — LIPASE, BLOOD: LIPASE: 23 U/L (ref 11–51)

## 2017-04-02 MED ORDER — DIPHENHYDRAMINE HCL 50 MG/ML IJ SOLN
12.5000 mg | Freq: Once | INTRAMUSCULAR | Status: AC
  Administered 2017-04-02: 12.5 mg via INTRAVENOUS
  Filled 2017-04-02: qty 1

## 2017-04-02 MED ORDER — ONDANSETRON HCL 4 MG/2ML IJ SOLN: 4.0000 mg | Freq: Once | INTRAMUSCULAR | Status: DC

## 2017-04-02 MED ORDER — ONDANSETRON 8 MG PO TBDP: 8.0000 mg | ORAL_TABLET | Freq: Three times a day (TID) | ORAL | 0 refills | Status: AC | PRN

## 2017-04-02 MED ORDER — SODIUM CHLORIDE 0.9 % IV BOLUS (SEPSIS)
2000.0000 mL | Freq: Once | INTRAVENOUS | Status: AC
  Administered 2017-04-02: 2000 mL via INTRAVENOUS

## 2017-04-02 MED ORDER — METOCLOPRAMIDE HCL 5 MG/ML IJ SOLN
10.0000 mg | Freq: Once | INTRAMUSCULAR | Status: AC
  Administered 2017-04-02: 10 mg via INTRAVENOUS
  Filled 2017-04-02: qty 2

## 2017-04-02 MED ORDER — SODIUM CHLORIDE 0.9 % IV SOLN: INTRAVENOUS | Status: DC

## 2017-04-02 NOTE — ED Provider Notes (Signed)
[facility]-EMERGENCY DEPT Provider Note   CSN: [id number] Arrival date & time: 04/02/17  0028   By signing my name below, I, Talbert Nan, attest that this documentation has been prepared under the direction and in the presence of Lorre Nick, MD. Electronically Signed: Talbert Nan, Scribe. 04/02/17. 1:11 AM.    History   Chief Complaint Chief Complaint  Patient presents with  . Emesis    HPI Angela Conrad is a 26 y.o. female who presents to the Emergency Department complaining of uncontrolled, persistent emesis that began yesterday. She had an associated migraine yesterday that has since resolved s/p nap, chills, crampy abdominal pain. She has no h/o abdominal surgery. LMP was 03/03/17. Pt denies diarrhea, dysuria, or burning sensation when urinating.Symptoms have been persistent and nothing makes them better or worse. No treatment use prior to arrival  The history is provided by the patient. No language interpreter was used.    Past Medical History:  Diagnosis Date  . Bronchitis   . No pertinent past medical history     There are no active problems to display for this patient.   Past Surgical History:  Procedure Laterality Date  . ADENOIDECTOMY      OB History    Gravida Para Term Preterm AB Living   2       2 0   SAB TAB Ectopic Multiple Live Births   2               Home Medications    Prior to Admission medications   Medication Sig Start Date End Date Taking? Authorizing Provider  cetirizine (ZYRTEC) 10 MG tablet Take 10 mg by mouth daily as needed for allergies.     Historical Provider, MD  ibuprofen (ADVIL,MOTRIN) 800 MG tablet Take 1 tablet (800 mg total) by mouth every 8 (eight) hours as needed. 06/09/16   Armando Reichert, CNM  Prenatal Vit-Fe Fumarate-FA (PRENATAL MULTIVITAMIN) TABS tablet Take 1 tablet by mouth daily at 12 noon.    Historical Provider, MD    Family History Family History  Problem Relation Age of Onset  . Diabetes Mother   . Diabetes Other    . Hypertension Other   . Anesthesia problems Neg Hx   . Hypotension Neg Hx   . Malignant hyperthermia Neg Hx   . Pseudochol deficiency Neg Hx     Social History Social History  Substance Use Topics  . Smoking status: Never Smoker  . Smokeless tobacco: Never Used  . Alcohol use 0.6 oz/week    1 Glasses of wine per week     Comment: 1 GLASS  OCC::         Allergies   Mushroom extract complex and Tomato   Review of Systems Review of Systems  All other systems reviewed and are negative.    Physical Exam Updated Vital Signs BP 128/86 (BP Location: Left Arm)   Pulse 81   Temp 97.8 F (36.6 C) (Oral)   Resp 14   Ht  (1.6 m)   Wt 223 lb 15.8 oz (101.6 kg)   LMP 03/11/2017 (Approximate)   SpO2 100%   BMI 39.68 kg/m    Physical Exam  Constitutional: She is oriented to person, place, and time. She appears well-developed and well-nourished.  Non-toxic appearance. No distress.  HENT:  Head: Normocephalic and atraumatic.  Eyes: Conjunctivae, EOM and lids are normal. Pupils are equal, round, and reactive to light.  Neck: Normal range of motion. Neck supple. No tracheal  deviation present. No thyroid mass present.  Cardiovascular: Normal rate, regular rhythm and normal heart sounds.  Exam reveals no gallop.   No murmur heard. Pulmonary/Chest: Effort normal and breath sounds normal. No stridor. No respiratory distress. She has no decreased breath sounds. She has no wheezes. She has no rhonchi. She has no rales.  Abdominal: Soft. Normal appearance and bowel sounds are normal. She exhibits no distension. There is no tenderness. There is no rebound and no CVA tenderness.  Musculoskeletal: Normal range of motion. She exhibits no edema or tenderness.  Neurological: She is alert and oriented to person, place, and time. She has normal strength. No cranial nerve deficit or sensory deficit. GCS eye subscore is 4. GCS verbal subscore is 5. GCS motor subscore is 6.  Skin: Skin is warm  and dry. No abrasion and no rash noted.  Psychiatric: She has a normal mood and affect. Her speech is normal and behavior is normal.  Nursing note and vitals reviewed.    ED Treatments / Results   DIAGNOSTIC STUDIES: Oxygen Saturation is 100% on room air, normal by my interpretation.    COORDINATION OF CARE: 1:09 AM Discussed treatment plan with pt at bedside and pt agreed to plan.    Labs (all labs ordered are listed, but only abnormal results are displayed) Labs Reviewed  LIPASE, BLOOD  COMPREHENSIVE METABOLIC PANEL  CBC  URINALYSIS, ROUTINE W REFLEX MICROSCOPIC  POC URINE PREG, ED  I-STAT BETA HCG BLOOD, ED ([facility], [facility], [facility] ONLY)    EKG  EKG Interpretation None       Radiology No results found.  Procedures Procedures (including critical care time)  Medications Ordered in ED Medications  sodium chloride 0.9 % bolus 2,000 mL (not administered)  0.9 %  sodium chloride infusion (not administered)  ondansetron (ZOFRAN) injection 4 mg (not administered)     Initial Impression / Assessment and Plan / ED Course  I have reviewed the triage vital signs and the nursing notes.  Pertinent labs & imaging results that were available during my care of the patient were reviewed by me and considered in my medical decision making (see chart for details).     Patient given IV hydration here as well as anti-medics. She is a longer vomiting at this time. Her UDS is positive for THC. 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 He has no laboratory evidence of dehydration. Urinalysis noted and will send for culture. She has no urinary symptoms. Will discharge home  Final Clinical Impressions(s) / ED Diagnoses   Final diagnoses:  None    New Prescriptions New Prescriptions   No medications on file   I personally performed the services described in this documentation, which was scribed in my presence. The recorded information has been reviewed and is accurate.       Lorre Nick,  MD 04/02/17 [phone]

## 2017-04-02 NOTE — ED Notes (Signed)
Bed: WA04 Expected date:  Expected time:  Means of arrival:  Comments: 

## 2017-04-02 NOTE — ED Triage Notes (Signed)
Pt c/o emesis since yesterday.  States that it began as a migraine.  Migraine is gone however pt continues to vomit.  Estimates 16 episodes of emesis in the last 24 hours.

## 2017-04-02 NOTE — ED Notes (Signed)
Pt resting quietly. No complaints at this time.

## 2017-04-03 LAB — URINE CULTURE

## 2019-11-18 ENCOUNTER — Encounter (HOSPITAL_COMMUNITY): Payer: Self-pay | Admitting: Emergency Medicine

## 2019-11-18 ENCOUNTER — Emergency Department (HOSPITAL_COMMUNITY)
Admission: EM | Admit: 2019-11-18 | Discharge: 2019-11-19 | Discharge status: Against Medical Advice | Payer: Self-pay | Attending: Emergency Medicine | Admitting: Emergency Medicine

## 2019-11-18 ENCOUNTER — Other Ambulatory Visit: Payer: Self-pay

## 2019-11-18 DIAGNOSIS — Z5321 Procedure and treatment not carried out due to patient leaving prior to being seen by health care provider: Secondary | ICD-10-CM | POA: Insufficient documentation

## 2019-11-18 LAB — COMPREHENSIVE METABOLIC PANEL
ALT: 30 U/L (ref 0–44)
AST: 21 U/L (ref 15–41)
Albumin: 3.8 g/dL (ref 3.5–5.0)
Alkaline Phosphatase: 86 U/L (ref 38–126)
Anion gap: 8 (ref 5–15)
BUN: 11 mg/dL (ref 6–20)
CO2: 27 mmol/L (ref 22–32)
Calcium: 9.5 mg/dL (ref 8.9–10.3)
Chloride: 104 mmol/L (ref 98–111)
Creatinine, Ser: 0.68 mg/dL (ref 0.44–1.00)
GFR calc Af Amer: >60 mL/min (ref >60)
GFR calc non Af Amer: >60 mL/min (ref >60)
Glucose, Bld: 82 mg/dL (ref 70–99)
Potassium: 3.6 mmol/L (ref 3.5–5.1)
Sodium: 139 mmol/L (ref 135–145)
Total Bilirubin: 0.3 mg/dL (ref 0.3–1.2)
Total Protein: 7.6 g/dL (ref 6.5–8.1)

## 2019-11-18 LAB — CBC
HCT: 45 % (ref 36.0–46.0)
Hemoglobin: 14.4 g/dL (ref 12.0–15.0)
MCH: 29.6 pg (ref 26.0–34.0)
MCHC: 32 g/dL (ref 30.0–36.0)
MCV: 92.4 fL (ref 80.0–100.0)
Platelets: 361 10*3/uL (ref 150–400)
RBC: 4.87 MIL/uL (ref 3.87–5.11)
RDW: 12 % (ref 11.5–15.5)
WBC: 10.1 10*3/uL (ref 4.0–10.5)
nRBC: 0 % (ref 0.0–0.2)

## 2019-11-18 LAB — I-STAT BETA HCG BLOOD, ED (MC, WL, AP ONLY): I-stat hCG, quantitative: <5 m[IU]/mL (ref <5)

## 2019-11-18 NOTE — ED Triage Notes (Signed)
Pt reports lower abd cramping since Wednesday. Reports increased bleeding. States her normal period is not heavy or has cramps.

## 2019-11-19 NOTE — ED Notes (Signed)
Pt called x3 for VS. No reply.

## 2021-06-02 ENCOUNTER — Emergency Department (HOSPITAL_BASED_OUTPATIENT_CLINIC_OR_DEPARTMENT_OTHER): Payer: Self-pay

## 2021-06-02 ENCOUNTER — Encounter (HOSPITAL_BASED_OUTPATIENT_CLINIC_OR_DEPARTMENT_OTHER): Payer: Self-pay

## 2021-06-02 ENCOUNTER — Emergency Department (HOSPITAL_BASED_OUTPATIENT_CLINIC_OR_DEPARTMENT_OTHER)
Admission: EM | Admit: 2021-06-02 | Discharge: 2021-06-03 | Disposition: A | Discharge status: Home / Self Care | Payer: Self-pay | Attending: Emergency Medicine | Admitting: Emergency Medicine

## 2021-06-02 ENCOUNTER — Other Ambulatory Visit: Payer: Self-pay

## 2021-06-02 DIAGNOSIS — S63501A Unspecified sprain of right wrist, initial encounter: Secondary | ICD-10-CM | POA: Insufficient documentation

## 2021-06-02 DIAGNOSIS — Y9339 Activity, other involving climbing, rappelling and jumping off: Secondary | ICD-10-CM | POA: Insufficient documentation

## 2021-06-02 DIAGNOSIS — W208XXA Other cause of strike by thrown, projected or falling object, initial encounter: Secondary | ICD-10-CM | POA: Insufficient documentation

## 2021-06-02 DIAGNOSIS — S63641A Sprain of metacarpophalangeal joint of right thumb, initial encounter: Secondary | ICD-10-CM | POA: Insufficient documentation

## 2021-06-02 MED ORDER — KETOROLAC TROMETHAMINE 30 MG/ML IJ SOLN
30.0000 mg | Freq: Once | INTRAMUSCULAR | Status: AC
  Administered 2021-06-02: 30 mg via INTRAMUSCULAR
  Filled 2021-06-02: qty 1

## 2021-06-02 NOTE — ED Provider Notes (Signed)
[facility] EMERGENCY DEPT Provider Note   CSN: [id number] Arrival date & time: 06/02/21  2312     History Chief Complaint  Patient presents with  . Arm Pain    Angela Conrad is a 30 y.o. female.  Pt presents to the ED today with right arm pain.  Pt said she was playing with her child and jumped into a recliner.  The pt said the recliner flipped on her and she fell very awkwardly with her right arm behind her.  She c/o pain mainly in her right 1st and 2nd fingers and some pain in her right wrist up to elbow.        Past Medical History:  Diagnosis Date  . Bronchitis   . No pertinent past medical history     There are no problems to display for this patient.   Past Surgical History:  Procedure Laterality Date  . ADENOIDECTOMY       OB History    Gravida  2   Para      Term      Preterm      AB  2   Living  0     SAB  2   IAB      Ectopic      Multiple      Live Births              Family History  Problem Relation Age of Onset  . Diabetes Mother   . Diabetes Other   . Hypertension Other   . Anesthesia problems Neg Hx   . Hypotension Neg Hx   . Malignant hyperthermia Neg Hx   . Pseudochol deficiency Neg Hx     Social History   Tobacco Use  . Smoking status: Never Smoker  . Smokeless tobacco: Never Used  Substance Use Topics  . Alcohol use: Yes    Alcohol/week: 1.0 standard drink    Types: 1 Glasses of wine per week    Comment: 1 GLASS  OCC::      . Drug use: No    Home Medications Prior to Admission medications   Medication Sig Start Date End Date Taking? Authorizing Provider  HYDROcodone-acetaminophen (NORCO/VICODIN) 5-325 MG tablet Take 1 tablet by mouth every 4 (four) hours as needed. 06/03/21  Yes Jacalyn Lefevre, MD  ibuprofen (ADVIL) 600 MG tablet Take 1 tablet (600 mg total) by mouth every 6 (six) hours as needed. 06/03/21  Yes Jacalyn Lefevre, MD  cetirizine (ZYRTEC) 10 MG tablet Take 10 mg by mouth daily as  needed for allergies.     [provider]  ondansetron (ZOFRAN ODT) 8 MG disintegrating tablet Take 1 tablet (8 mg total) by mouth every 8 (eight) hours as needed for nausea or vomiting. 04/02/17   Lorre Nick, MD  Prenatal Vit-Fe Fumarate-FA (PRENATAL MULTIVITAMIN) TABS tablet Take 1 tablet by mouth daily at 12 noon.    [provider]    Allergies    Mushroom extract complex and Tomato  Review of Systems   Review of Systems  Musculoskeletal:       Right hand, wrist, forearm, and elbow pain  All other systems reviewed and are negative.   Physical Exam Updated Vital Signs BP 122/82 (BP Location: Left Arm)   Pulse (!) 103   Temp 98.1 F (36.7 C) (Oral)   Resp 18   Ht 5\' 3"  (1.6 m)   Wt 90.7 kg   LMP 05/24/2021   SpO2 96%  BMI 35.43 kg/m   Physical Exam Vitals and nursing note reviewed.  Constitutional:      Appearance: Normal appearance.  HENT:     Head: Normocephalic and atraumatic.     Right Ear: External ear normal.     Left Ear: External ear normal.     Nose: Nose normal.     Mouth/Throat:     Mouth: Mucous membranes are moist.     Pharynx: Oropharynx is clear.  Eyes:     Extraocular Movements: Extraocular movements intact.     Conjunctiva/sclera: Conjunctivae normal.     Pupils: Pupils are equal, round, and reactive to light.  Cardiovascular:     Rate and Rhythm: Normal rate and regular rhythm.     Pulses: Normal pulses.     Heart sounds: Normal heart sounds.  Pulmonary:     Effort: Pulmonary effort is normal.     Breath sounds: Normal breath sounds.  Abdominal:     General: Abdomen is flat. Bowel sounds are normal.     Palpations: Abdomen is soft.  Musculoskeletal:     Right elbow: Decreased range of motion.     Right wrist: Decreased range of motion.     Right hand: Swelling present. Decreased range of motion.     Cervical back: Normal range of motion and neck supple.  Skin:    General: Skin is warm.     Capillary Refill:  Capillary refill takes less than 2 seconds.  Neurological:     General: No focal deficit present.     Mental Status: She is alert and oriented to person, place, and time.  Psychiatric:        Mood and Affect: Mood normal.        Behavior: Behavior normal.     ED Results / Procedures / Treatments   Labs (all labs ordered are listed, but only abnormal results are displayed) Labs Reviewed - No data to display  EKG None  Radiology DG Elbow Complete Right  Result Date: 06/02/2021 CLINICAL DATA:  Right elbow pain. EXAM: RIGHT ELBOW - COMPLETE 3+ VIEW COMPARISON:  None. FINDINGS: There is no evidence of fracture, dislocation, or joint effusion. Normal alignment and joint spaces. There is no evidence of arthropathy or other focal bone abnormality. Soft tissues are unremarkable. IMPRESSION: Negative radiographs of the right elbow. Electronically Signed   By: Narda Rutherford M.D.   On: 06/02/2021 23:57   DG Wrist Complete Right  Result Date: 06/02/2021 CLINICAL DATA:  Right wrist pain. EXAM: RIGHT WRIST - COMPLETE 3+ VIEW COMPARISON:  None. FINDINGS: There is no evidence of fracture or dislocation. Normal alignment and joint spaces. There is no evidence of arthropathy or other focal bone abnormality. Soft tissues are unremarkable. IMPRESSION: Negative radiographs of the right wrist. Electronically Signed   By: Narda Rutherford M.D.   On: 06/02/2021 23:56   DG Hand Complete Right  Result Date: 06/02/2021 CLINICAL DATA:  Right hand pain. EXAM: RIGHT HAND - COMPLETE 3+ VIEW COMPARISON:  None. FINDINGS: There is no evidence of fracture or dislocation. There is no evidence of arthropathy or other focal bone abnormality. Soft tissues are unremarkable. IMPRESSION: Negative radiographs of the right hand. Electronically Signed   By: Narda Rutherford M.D.   On: 06/02/2021 23:56    Procedures Procedures   Medications Ordered in ED Medications  ketorolac (TORADOL) 30 MG/ML injection 30 mg (30 mg  Intramuscular Given 06/02/21 2329)    ED Course  I have reviewed the triage  vital signs and the nursing notes.  Pertinent labs & imaging results that were available during my care of the patient were reviewed by me and considered in my medical decision making (see chart for details).    MDM Rules/Calculators/A&P                          X-rays reviewed.  No evidence of fx.  Pt is placed in a thumb spica splint and instructed to f/u with hand.  Return if worse. Final Clinical Impression(s) / ED Diagnoses Final diagnoses:  Sprain of right wrist, initial encounter  Sprain of metacarpophalangeal (MCP) joint of right thumb, initial encounter    Rx / DC Orders ED Discharge Orders         Ordered    HYDROcodone-acetaminophen (NORCO/VICODIN) 5-325 MG tablet  Every 4 hours PRN        06/03/21 0005    ibuprofen (ADVIL) 600 MG tablet  Every 6 hours PRN        06/03/21 0005           Jacalyn Lefevre, MD 06/03/21 0007

## 2021-06-02 NOTE — ED Triage Notes (Signed)
Pt c/o right arm pain. Pt states she was playing with her child and jumped in a recliner that then flipped over on her. Pt states her arm was behind her and is now hurting from her finger tips to her elbow.

## 2021-06-03 MED ORDER — HYDROCODONE-ACETAMINOPHEN 5-325 MG PO TABS: 1.0000 | ORAL_TABLET | ORAL | 0 refills | Status: AC | PRN

## 2021-06-03 MED ORDER — IBUPROFEN 600 MG PO TABS: 600.0000 mg | ORAL_TABLET | Freq: Four times a day (QID) | ORAL | 0 refills | Status: AC | PRN
